# Patient Record
Sex: Female | Born: 1970 | Race: White | Hispanic: No | Marital: Married | Smoking: Former smoker
Health system: Southern US, Community
[De-identification: ages and names within clinical notes are randomized; demographics above are authoritative.]

## PROBLEM LIST (undated history)

## (undated) DIAGNOSIS — G9332 Myalgic encephalomyelitis/chronic fatigue syndrome: Secondary | ICD-10-CM

## (undated) DIAGNOSIS — R053 Chronic cough: Secondary | ICD-10-CM

## (undated) DIAGNOSIS — E785 Hyperlipidemia, unspecified: Secondary | ICD-10-CM

## (undated) DIAGNOSIS — M67921 Unspecified disorder of synovium and tendon, right upper arm: Secondary | ICD-10-CM

## (undated) DIAGNOSIS — R232 Flushing: Secondary | ICD-10-CM

## (undated) DIAGNOSIS — E8881 Metabolic syndrome: Secondary | ICD-10-CM

## (undated) DIAGNOSIS — R5382 Chronic fatigue, unspecified: Secondary | ICD-10-CM

## (undated) DIAGNOSIS — R768 Other specified abnormal immunological findings in serum: Secondary | ICD-10-CM

## (undated) DIAGNOSIS — F5104 Psychophysiologic insomnia: Secondary | ICD-10-CM

## (undated) DIAGNOSIS — R600 Localized edema: Secondary | ICD-10-CM

## (undated) DIAGNOSIS — E559 Vitamin D deficiency, unspecified: Secondary | ICD-10-CM

## (undated) DIAGNOSIS — T7840XA Allergy, unspecified, initial encounter: Secondary | ICD-10-CM

## (undated) DIAGNOSIS — F418 Other specified anxiety disorders: Secondary | ICD-10-CM

## (undated) DIAGNOSIS — R0683 Snoring: Secondary | ICD-10-CM

## (undated) DIAGNOSIS — M359 Systemic involvement of connective tissue, unspecified: Secondary | ICD-10-CM

## (undated) DIAGNOSIS — M3500 Sicca syndrome, unspecified: Secondary | ICD-10-CM

## (undated) DIAGNOSIS — Z803 Family history of malignant neoplasm of breast: Secondary | ICD-10-CM

## (undated) DIAGNOSIS — R05 Cough: Secondary | ICD-10-CM

## (undated) DIAGNOSIS — M2550 Pain in unspecified joint: Secondary | ICD-10-CM

## (undated) DIAGNOSIS — M5412 Radiculopathy, cervical region: Secondary | ICD-10-CM

## (undated) HISTORY — DX: Allergy, unspecified, initial encounter: T78.40XA

## (undated) HISTORY — DX: Unspecified disorder of synovium and tendon, right upper arm: M67.921

## (undated) HISTORY — DX: Snoring: R06.83

## (undated) HISTORY — DX: Sjogren syndrome, unspecified: M35.00

## (undated) HISTORY — DX: Myalgic encephalomyelitis/chronic fatigue syndrome: G93.32

## (undated) HISTORY — DX: Hyperlipidemia, unspecified: E78.5

## (undated) HISTORY — DX: Chronic fatigue, unspecified: R53.82

## (undated) HISTORY — DX: Flushing: R23.2

## (undated) HISTORY — DX: Chronic cough: R05.3

## (undated) HISTORY — PX: ENDOMETRIAL ABLATION: SHX621

## (undated) HISTORY — DX: Cough: R05

## (undated) HISTORY — PX: CHOLECYSTECTOMY: SHX55

## (undated) HISTORY — DX: Radiculopathy, cervical region: M54.12

## (undated) HISTORY — DX: Metabolic syndrome: E88.81

## (undated) HISTORY — DX: Other specified anxiety disorders: F41.8

## (undated) HISTORY — DX: Other specified abnormal immunological findings in serum: R76.8

## (undated) HISTORY — DX: Pain in unspecified joint: M25.50

## (undated) HISTORY — DX: Systemic involvement of connective tissue, unspecified: M35.9

## (undated) HISTORY — DX: Psychophysiologic insomnia: F51.04

## (undated) HISTORY — PX: TUBAL LIGATION: SHX77

## (undated) HISTORY — PX: NASAL SINUS SURGERY: SHX719

## (undated) HISTORY — PX: ABDOMINAL HYSTERECTOMY: SHX81

## (undated) HISTORY — DX: Localized edema: R60.0

## (undated) HISTORY — DX: Metabolic syndrome: E88.810

## (undated) HISTORY — DX: Vitamin D deficiency, unspecified: E55.9

## (undated) HISTORY — DX: Family history of malignant neoplasm of breast: Z80.3

---

## 1998-03-13 HISTORY — PX: REDUCTION MAMMAPLASTY: SUR839

## 1998-03-13 HISTORY — PX: BREAST REDUCTION SURGERY: SHX8

## 2007-09-16 ENCOUNTER — Emergency Department: Payer: Self-pay | Admitting: Emergency Medicine

## 2007-10-30 ENCOUNTER — Ambulatory Visit: Payer: Self-pay | Admitting: Unknown Physician Specialty

## 2008-04-02 ENCOUNTER — Ambulatory Visit: Payer: Self-pay | Admitting: Unknown Physician Specialty

## 2008-06-16 ENCOUNTER — Ambulatory Visit: Payer: Self-pay

## 2008-10-28 ENCOUNTER — Ambulatory Visit: Payer: Self-pay | Admitting: Family Medicine

## 2009-05-09 ENCOUNTER — Emergency Department: Payer: Self-pay | Admitting: Emergency Medicine

## 2010-11-29 ENCOUNTER — Ambulatory Visit: Payer: Self-pay | Admitting: Family Medicine

## 2011-02-21 ENCOUNTER — Other Ambulatory Visit (HOSPITAL_COMMUNITY): Payer: Self-pay | Admitting: Orthopedic Surgery

## 2011-02-21 DIAGNOSIS — M25521 Pain in right elbow: Secondary | ICD-10-CM

## 2011-02-28 ENCOUNTER — Other Ambulatory Visit (HOSPITAL_COMMUNITY): Payer: Self-pay

## 2011-02-28 ENCOUNTER — Encounter (HOSPITAL_COMMUNITY): Payer: Self-pay

## 2011-03-06 ENCOUNTER — Encounter (HOSPITAL_COMMUNITY)
Admission: RE | Admit: 2011-03-06 | Discharge: 2011-03-06 | Disposition: A | Discharge status: Home / Self Care | Source: Ambulatory Visit | Attending: Orthopedic Surgery | Admitting: Orthopedic Surgery

## 2011-03-06 ENCOUNTER — Ambulatory Visit (HOSPITAL_COMMUNITY)
Admission: RE | Admit: 2011-03-06 | Discharge: 2011-03-06 | Disposition: A | Discharge status: Home / Self Care | Source: Ambulatory Visit | Attending: Orthopedic Surgery | Admitting: Orthopedic Surgery

## 2011-03-06 DIAGNOSIS — M25521 Pain in right elbow: Secondary | ICD-10-CM

## 2011-03-06 DIAGNOSIS — M25529 Pain in unspecified elbow: Secondary | ICD-10-CM | POA: Insufficient documentation

## 2011-03-06 MED ORDER — TECHNETIUM TC 99M MEDRONATE IV KIT
25.0000 | PACK | Freq: Once | INTRAVENOUS | Status: AC | PRN
  Administered 2011-03-06: 25 via INTRAVENOUS

## 2011-05-26 ENCOUNTER — Ambulatory Visit: Payer: Self-pay | Admitting: Family Medicine

## 2011-12-20 ENCOUNTER — Ambulatory Visit: Payer: Self-pay | Admitting: Family Medicine

## 2013-04-04 ENCOUNTER — Ambulatory Visit: Payer: Self-pay | Admitting: Family Medicine

## 2013-07-18 ENCOUNTER — Ambulatory Visit: Payer: Self-pay | Admitting: Family Medicine

## 2013-07-18 LAB — HM MAMMOGRAPHY: HM MAMMO: NORMAL

## 2013-09-16 ENCOUNTER — Ambulatory Visit: Payer: Self-pay | Admitting: Obstetrics and Gynecology

## 2013-09-16 LAB — BASIC METABOLIC PANEL
Anion Gap: 6 — ABNORMAL LOW (ref 7–16)
BUN: 11 mg/dL (ref 7–18)
CREATININE: 0.92 mg/dL (ref 0.60–1.30)
Calcium, Total: 8.9 mg/dL (ref 8.5–10.1)
Chloride: 101 mmol/L (ref 98–107)
Co2: 30 mmol/L (ref 21–32)
EGFR (African American): >60
Glucose: 89 mg/dL (ref 65–99)
OSMOLALITY: 273 (ref 275–301)
POTASSIUM: 3.8 mmol/L (ref 3.5–5.1)
Sodium: 137 mmol/L (ref 136–145)

## 2013-09-16 LAB — CBC
HCT: 43.7 % (ref 35.0–47.0)
HGB: 14.9 g/dL (ref 12.0–16.0)
MCH: 30.4 pg (ref 26.0–34.0)
MCHC: 34 g/dL (ref 32.0–36.0)
MCV: 89 fL (ref 80–100)
PLATELETS: 280 10*3/uL (ref 150–440)
RBC: 4.89 10*6/uL (ref 3.80–5.20)
RDW: 13.7 % (ref 11.5–14.5)
WBC: 6 10*3/uL (ref 3.6–11.0)

## 2013-09-22 ENCOUNTER — Ambulatory Visit: Payer: Self-pay | Admitting: Obstetrics and Gynecology

## 2013-09-25 LAB — PATHOLOGY REPORT

## 2014-03-20 LAB — LIPID PANEL
CHOLESTEROL: 187 mg/dL (ref 0–200)
HDL: 58 mg/dL (ref 35–70)
LDL Cholesterol: 98 mg/dL
Triglycerides: 155 mg/dL (ref 40–160)

## 2014-07-04 NOTE — Op Note (Signed)
PATIENT NAME:  Tracy Tracy [name], Tracy Tracy [name] MR#:  [id number] DATE OF BIRTH:  03/27/1970  DATE OF PROCEDURE:  09/22/2013  PREOPERATIVE DIAGNOSES:  1. Chronic pelvic pain.  2. Family history of ovarian cancer.  3. Ovarian mass.   POSTOPERATIVE DIAGNOSES:  1. Chronic pelvic pain.  2. Family history of ovarian cancer.  3. Ovarian mass. 4. Pelvic adhesive disease.   SURGICAL PROCEDURE:  Laparoscopic BSO.   SURGEON: Dr. Daphine DeutscherMartin Tiasia Weberg.   FIRST ASSISTANT: Dr. Valentino Saxonherry.   ANESTHESIA: General endotracheal.   INDICATIONS: The patient is Tracy [name] 44 year old white female status post hysterectomy in the past with chronic pelvic pain and family history of ovarian cancer. Recent ultrasound demonstrated Tracy [name] possible lesion consistent with Tracy [name] dermoid cyst. The patient desired definitive treatment.   FINDINGS AT SURGERY:  Revealed extensive bowel, omental, and adnexal adhesions bilaterally. Adhesiolysis was performed. Tubes and ovaries were removed.   DESCRIPTION OF THE PROCEDURE: The patient was brought to the operating room, was placed in the supine position. General endotracheal anesthesia was induced without difficulty. She was placed in the dorsal lithotomy position using the bumblebee stirrups. Tracy [name] ChloraPrep and Betadine abdominal, perineal, intravaginal prep and drape was performed in standard fashion.  Red Robinsion catheter was used to drain 25 mL of urine from the bladder. Tracy [name] sponge stick was placed into the vagina. Tracy [name] subumbilical vertical incision 5 mm in length was made. The Optiview laparoscopic trocar system was used to place Tracy [name] 5 mm port under direct entry into the abdominal pelvic cavity. No bowel or vascular injury was encountered. Pneumoperitoneum was created. Tracy [name] 10 mm port was placed in the right lower quadrant and Tracy [name] 5 mm port in the left lower quadrants, respectively. The findings were photodocumented. The adhesiolysis and oophorectomy was then performed with both the Ace Harmonic scalpel as well as the  aid of alligator graspers. The adhesions between the omentum and bowel and pelvic sidewall were taken down using the Ace Harmonic scalpel. Once anatomy was reasonably restored, the infundibulopelvic ligament was clamped, desiccated, and cut. The remainder of the ligamentous attachments to the pelvic sidewall were likewise clamped, desiccated, and cut. This freed up the ovary and tube. Sequentially, each tube and ovary was removed with the EndoCatch instrument. Tracy [name] similar procedure was carried out on the contralateral side. Good hemostasis was noted. The ureters were noted to be not in the region of the adhesiolysis. Upon completion of the procedure, the instrumentation was removed from the abdominopelvic cavity. Pneumoperitoneum was released. The incisions were closed with 0 Vicryl on the fascia in the 10 mm port site. The other incisions were closed with 4-0 plain suture and Dermabond. Dressings were placed over the incisions. The patient was then awakened, mobilized, and taken to the recovery room in satisfactory condition.  Estimated blood loss was 10 mL. Urine output was 25 mL.  IV fluids were 2 liters.  No antibiotics were given prophylactically. All instruments, needle, and sponge counts were verified as correct.    ____________________________ Tracy Tracy [name]. Wade Sigala, MD mad:dd D: 09/22/2013 16:56:25 ET T: 09/23/2013 02:50:35 ET JOB#: [id number]  cc: Daphine DeutscherMartin Tracy [name]. Tracy Grillo, MD, <Dictator> [facility] Tracy Tracy [name] Demetreus Lothamer MD ELECTRONICALLY SIGNED 09/23/2013 13:29

## 2014-09-17 ENCOUNTER — Encounter: Payer: Self-pay | Admitting: Obstetrics and Gynecology

## 2014-09-28 ENCOUNTER — Other Ambulatory Visit: Payer: Self-pay | Admitting: Family Medicine

## 2014-09-28 DIAGNOSIS — Z1231 Encounter for screening mammogram for malignant neoplasm of breast: Secondary | ICD-10-CM

## 2014-10-01 ENCOUNTER — Ambulatory Visit

## 2014-10-23 ENCOUNTER — Other Ambulatory Visit: Payer: Self-pay | Admitting: Family Medicine

## 2014-10-23 NOTE — Telephone Encounter (Signed)
Patient requesting refill. 

## 2014-10-26 ENCOUNTER — Other Ambulatory Visit: Payer: Self-pay | Admitting: Family Medicine

## 2014-12-02 ENCOUNTER — Encounter: Payer: Self-pay | Admitting: Family Medicine

## 2014-12-02 DIAGNOSIS — M659 Synovitis and tenosynovitis, unspecified: Secondary | ICD-10-CM | POA: Insufficient documentation

## 2014-12-08 ENCOUNTER — Encounter: Payer: Self-pay | Admitting: Family Medicine

## 2014-12-08 DIAGNOSIS — M6790 Unspecified disorder of synovium and tendon, unspecified site: Secondary | ICD-10-CM | POA: Insufficient documentation

## 2014-12-08 DIAGNOSIS — M5412 Radiculopathy, cervical region: Secondary | ICD-10-CM | POA: Insufficient documentation

## 2014-12-08 DIAGNOSIS — Z90710 Acquired absence of both cervix and uterus: Secondary | ICD-10-CM | POA: Insufficient documentation

## 2014-12-08 DIAGNOSIS — Z720 Tobacco use: Secondary | ICD-10-CM | POA: Insufficient documentation

## 2014-12-08 DIAGNOSIS — E785 Hyperlipidemia, unspecified: Secondary | ICD-10-CM | POA: Insufficient documentation

## 2014-12-08 DIAGNOSIS — E8881 Metabolic syndrome: Secondary | ICD-10-CM | POA: Insufficient documentation

## 2014-12-08 DIAGNOSIS — R5382 Chronic fatigue, unspecified: Secondary | ICD-10-CM

## 2014-12-08 DIAGNOSIS — R760 Raised antibody titer: Secondary | ICD-10-CM | POA: Insufficient documentation

## 2014-12-08 DIAGNOSIS — D8989 Other specified disorders involving the immune mechanism, not elsewhere classified: Secondary | ICD-10-CM | POA: Insufficient documentation

## 2014-12-08 DIAGNOSIS — F329 Major depressive disorder, single episode, unspecified: Secondary | ICD-10-CM | POA: Insufficient documentation

## 2014-12-08 DIAGNOSIS — E669 Obesity, unspecified: Secondary | ICD-10-CM | POA: Insufficient documentation

## 2014-12-08 DIAGNOSIS — R600 Localized edema: Secondary | ICD-10-CM | POA: Insufficient documentation

## 2014-12-08 DIAGNOSIS — R9431 Abnormal electrocardiogram [ECG] [EKG]: Secondary | ICD-10-CM | POA: Insufficient documentation

## 2014-12-08 DIAGNOSIS — M3500 Sicca syndrome, unspecified: Secondary | ICD-10-CM | POA: Insufficient documentation

## 2014-12-08 DIAGNOSIS — Z803 Family history of malignant neoplasm of breast: Secondary | ICD-10-CM | POA: Insufficient documentation

## 2014-12-08 DIAGNOSIS — G4700 Insomnia, unspecified: Secondary | ICD-10-CM | POA: Insufficient documentation

## 2014-12-08 DIAGNOSIS — R0683 Snoring: Secondary | ICD-10-CM | POA: Insufficient documentation

## 2014-12-08 DIAGNOSIS — J302 Other seasonal allergic rhinitis: Secondary | ICD-10-CM | POA: Insufficient documentation

## 2014-12-08 DIAGNOSIS — G9332 Myalgic encephalomyelitis/chronic fatigue syndrome: Secondary | ICD-10-CM | POA: Insufficient documentation

## 2014-12-09 ENCOUNTER — Ambulatory Visit (INDEPENDENT_AMBULATORY_CARE_PROVIDER_SITE_OTHER): Admitting: Family Medicine

## 2014-12-09 ENCOUNTER — Encounter: Payer: Self-pay | Admitting: Family Medicine

## 2014-12-09 VITALS — BP 112/84 | HR 107 | Temp 98.8°F | Resp 18 | Ht 63.0 in | Wt 211.0 lb

## 2014-12-09 DIAGNOSIS — E785 Hyperlipidemia, unspecified: Secondary | ICD-10-CM | POA: Diagnosis not present

## 2014-12-09 DIAGNOSIS — M26629 Arthralgia of temporomandibular joint, unspecified side: Secondary | ICD-10-CM | POA: Insufficient documentation

## 2014-12-09 DIAGNOSIS — Z1239 Encounter for other screening for malignant neoplasm of breast: Secondary | ICD-10-CM

## 2014-12-09 DIAGNOSIS — M2662 Arthralgia of temporomandibular joint: Secondary | ICD-10-CM

## 2014-12-09 DIAGNOSIS — L659 Nonscarring hair loss, unspecified: Secondary | ICD-10-CM | POA: Diagnosis not present

## 2014-12-09 DIAGNOSIS — M359 Systemic involvement of connective tissue, unspecified: Secondary | ICD-10-CM | POA: Diagnosis not present

## 2014-12-09 DIAGNOSIS — E8881 Metabolic syndrome: Secondary | ICD-10-CM

## 2014-12-09 DIAGNOSIS — Z114 Encounter for screening for human immunodeficiency virus [HIV]: Secondary | ICD-10-CM

## 2014-12-09 DIAGNOSIS — Z0000 Encounter for general adult medical examination without abnormal findings: Secondary | ICD-10-CM

## 2014-12-09 DIAGNOSIS — Z23 Encounter for immunization: Secondary | ICD-10-CM | POA: Diagnosis not present

## 2014-12-09 DIAGNOSIS — M797 Fibromyalgia: Secondary | ICD-10-CM | POA: Diagnosis not present

## 2014-12-09 DIAGNOSIS — E8940 Asymptomatic postprocedural ovarian failure: Secondary | ICD-10-CM

## 2014-12-09 DIAGNOSIS — N958 Other specified menopausal and perimenopausal disorders: Secondary | ICD-10-CM

## 2014-12-09 DIAGNOSIS — Z01419 Encounter for gynecological examination (general) (routine) without abnormal findings: Secondary | ICD-10-CM

## 2014-12-09 MED ORDER — ESTRADIOL 1 MG PO TABS: 1.0000 mg | ORAL_TABLET | Freq: Every day | ORAL | Status: DC

## 2014-12-09 MED ORDER — OXYCODONE HCL 10 MG PO TABS: 10.0000 mg | ORAL_TABLET | Freq: Four times a day (QID) | ORAL | Status: DC

## 2014-12-09 NOTE — Progress Notes (Signed)
Name: Tracy [name]   MRN: [id number]    DOB: Jan 06, 1971   Date:12/09/2014       Progress Note  Subjective  Chief Complaint  Chief Complaint  Patient presents with  . Annual Exam    HPI  Well Woman Exam:  She is status post-hysterectomy and oophorectomy , on Estradiol 1 mg daily to control her menopausal symptoms. Denies hot flashes or night sweats.    Hair Loss: she has noticed significant hair loss over the past 6 months, getting worse. She loses clumps of hair after showering.   Connective Tissue Disorder/CFS/Sjogreens Disease: she sees Dr. Janene Harvey in [place] . She is feeling very tired, on Oxycodone for pain control and is waiting for referral to pain clinic. She would like to have labs done so I can send results to Rheumatologist. She has skin photosensitivity, dry mouth, dry eyes, muscles aches, joint aches. Feels tired all the time. Taking medications given by Dr. Janene Harvey.   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 Denies polyphagia,  or polyuria. Drinks water all the time because of Sjogrens  Hyperlipidemia: taking Pravastatin and is compliant with medication. No chest pain or palpitation  Patient Active Problem List   Diagnosis Date Noted  . Connective tissue disorder 12/09/2014  . Abnormal ECG 12/08/2014  . Abnormal antinuclear antibody titer 12/08/2014  . Anxiety and depression 12/08/2014  . Edema leg 12/08/2014  . 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 Dyslipidemia 12/08/2014  . Family history of breast cancer 12/08/2014  . H/O: hysterectomy 12/08/2014  . Dysmetabolic syndrome 12/08/2014  . Obesity (BMI 30-39.9) 12/08/2014  . Tobacco abuse 12/08/2014  . Disorder of tendon 12/08/2014  . Sjogren's syndrome 12/08/2014  . Allergic rhinitis, seasonal 12/08/2014  . Snores 12/08/2014  .  Tenosynovitis of thumb 12/02/2014    Past Surgical History  Procedure Laterality Date  . Abdominal hysterectomy    . Cholecystectomy    . Nasal sinus surgery    . Tubal ligation    . Endometrial ablation      ovarian cyst removal  . Breast reduction surgery Bilateral 2000    Illionis    Family History  Problem Relation Age of Onset  . Hypertension Mother   . Allergic rhinitis Mother   . Hypertension Father   . Alcohol abuse Father   . Heart disease Father   . Alcohol abuse Brother   . Hypertension Brother     Social History   Social History  . Marital Status: Married    Spouse Name: N/A  . Number of Children: N/A  . Years of Education: N/A   Occupational History  . Not on file.   Social History Main Topics  . Smoking status: Current Every Day Smoker -- 0.75 packs/day for 26 years    Types: Cigarettes    Start date: 12/08/1988  . Smokeless tobacco: Never Used  . Alcohol Use: No  . Drug Use: No  . Sexual Activity:    Partners: Male    Birth Control/ Protection: Other-see comments     Comment: Hysterectomy   Other Topics Concern  . Not on file   Social History Narrative     Current outpatient prescriptions:  .  amitriptyline (ELAVIL) 25 MG tablet, Take by mouth., Disp: , Rfl:  .  amphetamine-dextroamphetamine (ADDERALL) 10 MG tablet, Take by mouth., Disp: , Rfl:  .  aspirin 81 MG tablet, Take by mouth., Disp: , Rfl:  .  betamethasone dipropionate (DIPROLENE) 0.05 % cream, BETAMETHASONE DIPROPIONATE, 0.05% (External Cream) - Historical Medication  apply twice a day as needed (0.05 %) Active Comments: Medication taken as needed. , Disp: , Rfl:  .  Biotin (BIOTIN 5000) 5 MG CAPS, Take by mouth., Disp: , Rfl:  .  Cholecalciferol (VITAMIN D) 2000 UNITS tablet, Take by mouth., Disp: , Rfl:  .  diazepam (VALIUM) 5 MG tablet, Take by mouth., Disp: , Rfl:  .  DULoxetine (CYMBALTA) 30 MG capsule, Take by mouth., Disp: , Rfl:  .  estradiol (ESTRACE) 1 MG tablet,  Take 1 tablet (1 mg total) by mouth daily., Disp: 90 tablet, Rfl: 4 .  fluticasone (FLONASE) 50 MCG/ACT nasal spray, Place into the nose., Disp: , Rfl:  .  Folic Acid-Vit B6-Vit B12 (B COMPLEX-FOLIC ACID) 500-5-200 MCG-MG-MCG TABS, Take by mouth., Disp: , Rfl:  .  furosemide (LASIX) 40 MG tablet, Take by mouth., Disp: , Rfl:  .  hydroxychloroquine (PLAQUENIL) 200 MG tablet, Take by mouth., Disp: , Rfl:  .  Multiple Vitamins-Minerals (MULTI VITAMIN/MINERALS) TABS, Take by mouth., Disp: , Rfl:  .  mycophenolate (CELLCEPT) 500 MG tablet, Take by mouth., Disp: , Rfl:  .  nabumetone (RELAFEN) 750 MG tablet, Take by mouth., Disp: , Rfl:  .  pravastatin (PRAVACHOL) 20 MG tablet, TAKE 1 TABLET EVERY EVENING FOR CHOLESTEROL, Disp: 90 tablet, Rfl: 1 .  tizanidine (ZANAFLEX) 2 MG capsule, Take by mouth., Disp: , Rfl:  .  Oxycodone HCl 10 MG TABS, Take 1 tablet (10 mg total) by mouth 4 (four) times daily., Disp: 120 tablet, Rfl: 0  Allergies  Allergen Reactions  . Gabapentin Other (See Comments) and Swelling  . Meperidine   . Tramadol      ROS  Constitutional: Negative for fever or weight change.  Respiratory: Negative for cough and shortness of breath.   Cardiovascular: Negative for chest pain or palpitations.  Gastrointestinal: Negative for abdominal pain, no bowel changes.  Musculoskeletal: Negative for gait problem , positive for  joint swelling - tenosynovitis.  Skin: Negative for rash.  Neurological: Negative for dizziness or headache.  No other specific complaints in a complete review of systems (except as listed in HPI above).  Objective  Filed Vitals:   12/09/14 0851  BP: 112/84  Pulse: 107  Temp: 98.8 F (37.1 C)  TempSrc: Oral  Resp: 18  Height:  (1.6 m)  Weight: 211 lb (95.709 kg)  SpO2: 96%    Body mass index is 37.39 kg/(m^2).  Physical Exam  Constitutional: Patient appears well-developed and well-nourished. No distress.  HENT: Head: Normocephalic and  atraumatic. Ears: B TMs ok, no erythema or effusion; Nose: Nose normal. Mouth/Throat: Oropharynx is clear and moist. No oropharyngeal exudate.  Eyes: Conjunctivae and EOM are normal. Pupils are equal, round, and reactive to light. No scleral icterus.  Neck: Normal range of motion. Neck supple. No JVD present. No thyromegaly present.  Cardiovascular: Normal rate, regular rhythm and normal heart sounds.  No murmur heard. No BLE edema. Pulmonary/Chest: Effort normal and breath sounds normal. No respiratory distress. Abdominal: Soft. Bowel sounds are normal, no distension. Mild tenderness on LLQ - possibly from scar tissue. no masses Breast: no lumps or masses, no nipple discharge or rashes, scars from breast reduction surgery  FEMALE GENITALIA:  External genitalia normal External urethra normal Vaginal vault normal without discharge or lesions RECTAL: not done Musculoskeletal: Normal range of  motion, no joint effusions.  Trigger point positives Neurological: he is alert and oriented to person, place, and time. No cranial nerve deficit. Coordination, balance, strength, speech and gait are normal.  Skin: Skin is warm and dry. No rash noted. No erythema.  Psychiatric: Patient has a normal mood and affect. behavior is normal. Judgment and thought content normal.  PHQ2/9: Depression screen PHQ 2/9 12/09/2014  Decreased Interest 0  Down, Depressed, Hopeless 0  PHQ - 2 Score 0     Fall Risk: Fall Risk  12/09/2014  Falls in the past year? Yes  Number falls in past yr: 2 or more  Injury with Fall? Yes    Functional Status Survey: Is the patient deaf or have difficulty hearing?: No Does the patient have difficulty seeing, even when wearing glasses/contacts?: No Does the patient have difficulty concentrating, remembering, or making decisions?: No Does the patient have difficulty walking or climbing stairs?: No Does the patient have difficulty dressing or bathing?: No Does the patient have  difficulty doing errands alone such as visiting a doctor's office or shopping?: No   Assessment & Plan  1. Well woman exam  Discussed importance of 150 minutes of physical activity weekly, eat two servings of fish weekly, eat one serving of tree nuts ( cashews, pistachios, pecans, almonds.Marland Kitchen) every other day, eat 6 servings of fruit/vegetables daily and drink plenty of water and avoid sweet beverages.    - CBC with Differential/Platelet - Comprehensive metabolic panel - TSH - Vitamin B12 - Vit D  25 hydroxy (rtn osteoporosis monitoring)  2. Needs flu shot  - Flu Vaccine QUAD 36+ mos PF IM (Fluarix & Fluzone Quad PF)  3. Hair loss  - TSH  4. Encounter for screening for HIV  - HIV antibody  5. Dyslipidemia  Continue medication  - Lipid panel  6. Dysmetabolic syndrome  Discussed healthy diet  - Hemoglobin A1c  7. Connective tissue disorder  Oxycodone prescription was not given to her - it was just entered in the system because she is getting it from Dr. Janene Harvey - C-reactive protein - Rheumatoid Factor - Antinuclear Antib (ANA) - Sjogrens syndrome-A extractable nuclear antibody - Sjogrens syndrome-B extractable nuclear antibody  8. Surgical menopause  - estradiol (ESTRACE) 1 MG tablet; Take 1 tablet (1 mg total) by mouth daily.  Dispense: 90 tablet; Refill: 4  - C-reactive protein - Rheumatoid Factor - Antinuclear Antib (ANA) - Sjogrens syndrome-A extractable nuclear antibody - Sjogrens syndrome-B extractable nuclear antibody  9. TMJ arthralgia   10. Fibromyalgia  Continue Duloxetine and Elavi  11. Breast cancer screening  - MM Digital Screening; Future

## 2014-12-10 LAB — CBC WITH DIFFERENTIAL/PLATELET
BASOS ABS: 0 10*3/uL (ref 0.0–0.2)
Basos: 0 %
EOS (ABSOLUTE): 0.1 10*3/uL (ref 0.0–0.4)
Eos: 2 %
Hematocrit: 42.3 % (ref 34.0–46.6)
Hemoglobin: 14.8 g/dL (ref 11.1–15.9)
IMMATURE GRANULOCYTES: 0 %
Immature Grans (Abs): 0 10*3/uL (ref 0.0–0.1)
LYMPHS: 34 %
Lymphocytes Absolute: 1.7 10*3/uL (ref 0.7–3.1)
MCH: 30.2 pg (ref 26.6–33.0)
MCHC: 35 g/dL (ref 31.5–35.7)
MCV: 86 fL (ref 79–97)
MONOS ABS: 0.3 10*3/uL (ref 0.1–0.9)
Monocytes: 5 %
NEUTROS ABS: 2.9 10*3/uL (ref 1.4–7.0)
NEUTROS PCT: 59 %
PLATELETS: 286 10*3/uL (ref 150–379)
RBC: 4.9 x10E6/uL (ref 3.77–5.28)
RDW: 13 % (ref 12.3–15.4)
WBC: 5.1 10*3/uL (ref 3.4–10.8)

## 2014-12-10 LAB — HIV ANTIBODY (ROUTINE TESTING W REFLEX): HIV SCREEN 4TH GENERATION: NONREACTIVE

## 2014-12-10 LAB — LIPID PANEL
CHOL/HDL RATIO: 3.5 ratio (ref 0.0–4.4)
Cholesterol, Total: 219 mg/dL — ABNORMAL HIGH (ref 100–199)
HDL: 62 mg/dL (ref >39)
LDL Calculated: 111 mg/dL — ABNORMAL HIGH (ref 0–99)
Triglycerides: 231 mg/dL — ABNORMAL HIGH (ref 0–149)
VLDL Cholesterol Cal: 46 mg/dL — ABNORMAL HIGH (ref 5–40)

## 2014-12-10 LAB — TSH: TSH: 2.6 u[IU]/mL (ref 0.450–4.500)

## 2014-12-10 LAB — COMPREHENSIVE METABOLIC PANEL
A/G RATIO: 1.4 (ref 1.1–2.5)
ALT: 11 IU/L (ref 0–32)
AST: 12 IU/L (ref 0–40)
Albumin: 4.2 g/dL (ref 3.5–5.5)
Alkaline Phosphatase: 101 IU/L (ref 39–117)
BUN/Creatinine Ratio: 13 (ref 9–23)
BUN: 12 mg/dL (ref 6–24)
CHLORIDE: 94 mmol/L — AB (ref 97–108)
CO2: 30 mmol/L — ABNORMAL HIGH (ref 18–29)
Calcium: 9.9 mg/dL (ref 8.7–10.2)
Creatinine, Ser: 0.89 mg/dL (ref 0.57–1.00)
GFR calc Af Amer: 91 mL/min/{1.73_m2} (ref >59)
GFR calc non Af Amer: 79 mL/min/{1.73_m2} (ref >59)
GLUCOSE: 96 mg/dL (ref 65–99)
Globulin, Total: 3 g/dL (ref 1.5–4.5)
POTASSIUM: 3.4 mmol/L — AB (ref 3.5–5.2)
Sodium: 143 mmol/L (ref 134–144)
Total Protein: 7.2 g/dL (ref 6.0–8.5)

## 2014-12-10 LAB — HEMOGLOBIN A1C
ESTIMATED AVERAGE GLUCOSE: 126 mg/dL
HEMOGLOBIN A1C: 6 % — AB (ref 4.8–5.6)

## 2014-12-10 LAB — VITAMIN B12: Vitamin B-12: 485 pg/mL (ref 211–946)

## 2014-12-10 LAB — VITAMIN D 25 HYDROXY (VIT D DEFICIENCY, FRACTURES): Vit D, 25-Hydroxy: 30.2 ng/mL (ref 30.0–100.0)

## 2014-12-10 LAB — RHEUMATOID FACTOR: Rhuematoid fact SerPl-aCnc: <10 IU/mL (ref 0.0–13.9)

## 2014-12-10 LAB — SJOGRENS SYNDROME-B EXTRACTABLE NUCLEAR ANTIBODY: ENA SSB (LA) AB: 7.5 AI — AB (ref 0.0–0.9)

## 2014-12-10 LAB — ANA: Anti Nuclear Antibody(ANA): POSITIVE — AB

## 2014-12-10 LAB — SJOGRENS SYNDROME-A EXTRACTABLE NUCLEAR ANTIBODY

## 2014-12-10 LAB — C-REACTIVE PROTEIN: CRP: 3.6 mg/L (ref 0.0–4.9)

## 2014-12-11 NOTE — Progress Notes (Signed)
Patient notified and states she will pick up a copy of her labs and take them to her Rheumatologist.

## 2015-01-19 ENCOUNTER — Other Ambulatory Visit: Payer: Self-pay | Admitting: Family Medicine

## 2015-01-19 NOTE — Telephone Encounter (Signed)
Sent to Dr. Sowles  

## 2015-01-30 ENCOUNTER — Ambulatory Visit: Admission: EM | Admit: 2015-01-30 | Discharge: 2015-01-30 | Disposition: A | Discharge status: Home / Self Care

## 2015-02-01 ENCOUNTER — Encounter: Payer: Self-pay | Admitting: Emergency Medicine

## 2015-02-01 ENCOUNTER — Ambulatory Visit
Admission: EM | Admit: 2015-02-01 | Discharge: 2015-02-01 | Disposition: A | Discharge status: Home / Self Care | Attending: Family Medicine | Admitting: Family Medicine

## 2015-02-01 DIAGNOSIS — J029 Acute pharyngitis, unspecified: Secondary | ICD-10-CM

## 2015-02-01 DIAGNOSIS — D899 Disorder involving the immune mechanism, unspecified: Secondary | ICD-10-CM

## 2015-02-01 DIAGNOSIS — K1370 Unspecified lesions of oral mucosa: Secondary | ICD-10-CM | POA: Diagnosis not present

## 2015-02-01 DIAGNOSIS — B349 Viral infection, unspecified: Secondary | ICD-10-CM | POA: Diagnosis not present

## 2015-02-01 DIAGNOSIS — Z79899 Other long term (current) drug therapy: Secondary | ICD-10-CM

## 2015-02-01 DIAGNOSIS — D84821 Immunodeficiency due to drugs: Secondary | ICD-10-CM

## 2015-02-01 LAB — RAPID STREP SCREEN (MED CTR MEBANE ONLY): Streptococcus, Group A Screen (Direct): NEGATIVE

## 2015-02-01 MED ORDER — AZITHROMYCIN 250 MG PO TABS: ORAL_TABLET | ORAL | Status: DC

## 2015-02-01 MED ORDER — FIRST-DUKES MOUTHWASH MT SUSP: 20.0000 mL | Freq: Three times a day (TID) | OROMUCOSAL | Status: DC | PRN

## 2015-02-01 NOTE — ED Notes (Signed)
Patient states she developed mouth sores on Saturday, some blisters and redness today

## 2015-02-01 NOTE — ED Provider Notes (Signed)
CSN: [id number]     Arrival date & time 02/01/15  1523 History   First MD Initiated Contact with Patient 02/01/15 1632    Nurses notes were reviewed. Chief Complaint  Patient presents with  . Mouth Lesions   patient states she started having mouth lesions on Friday. Mouth is very painful and cause considerable amount of discomfort. She states that lesions were on the inside of her mouth in the back or throat. By Saturday symptoms were worse she came to the urgent care but because of the long wait 7 to go home which went to bed and stepped to rest Saturday night and Sunday. Today still with ulcerations and lesions in her mouth she came in to be seen and evaluated. She denies any lesions on her hands or feet and states no history of herpes that she is aware of. Fact she was tested for chickenpox and she thought she had chickenpox before but she was found not to be immune at this time. She is taking immune modulators because of her history of surgeons disease and unspecified connective tissue disease. She called her rheumatologist who wanted to be checked for strep because of her weakened immune system system. A portion patient still smokes   (Consider location/radiation/quality/duration/timing/severity/associated sxs/prior Treatment) Patient is a 44 y.o. female presenting with mouth sores. The history is provided by the patient. No language interpreter was used.  Mouth Lesions Location:  Lower lip and buccal mucosa Quality:  Multiple Onset quality:  Sudden Severity:  Severe Progression:  Worsening Chronicity:  New Context: possible infection and stress   Context: not a change in diet and not a change in medications   Relieved by:  Nothing Ineffective treatments:  Topical medications (States gargle saltwater only makes it worse and saltwater [place]) Associated symptoms: congestion, dental pain, malaise, rhinorrhea, sore throat and swollen glands   Associated symptoms: no ear pain, no fever,  no neck pain and no rash     Past Medical History  Diagnosis Date  . Cervical radiculopathy     Dr. Percell Belt  . Positive ANA (antinuclear antibody)   . Anxious depression   . Hot flashes   . Snoring   . Family history of breast cancer   . Polyarthralgia   . Chronic fatigue syndrome   . Dyslipidemia   . Bilateral edema of lower extremity   . Allergy   . Sjogren's disease (HCC)     Dr. Janene Harvey  . Vitamin D deficiency   . Metabolic syndrome   . Connective tissue disease (HCC)   . Chronic insomnia   . Tendinopathy of right elbow   . Chronic cough    Past Surgical History  Procedure Laterality Date  . Abdominal hysterectomy    . Cholecystectomy    . Nasal sinus surgery    . Tubal ligation    . Endometrial ablation      ovarian cyst removal  . Breast reduction surgery Bilateral 2000    Illionis   Family History  Problem Relation Age of Onset  . Hypertension Mother   . Allergic rhinitis Mother   . Hypertension Father   . Alcohol abuse Father   . Heart disease Father   . Alcohol abuse Brother   . Hypertension Brother    Social History  Substance Use Topics  . Smoking status: Current Every Day Smoker -- 0.75 packs/day for 26 years    Types: Cigarettes    Start date: 12/08/1988  . Smokeless tobacco: Never Used  .  Alcohol Use: No   OB History    No data available     Review of Systems  Constitutional: Negative for fever.  HENT: Positive for congestion, mouth sores, rhinorrhea and sore throat. Negative for ear pain.   Musculoskeletal: Negative for neck pain.  Skin: Negative for rash.  All other systems reviewed and are negative.   Allergies  Gabapentin; Meperidine; and Tramadol  Home Medications   Prior to Admission medications   Medication Sig Start Date End Date Taking? Authorizing Provider  amitriptyline (ELAVIL) 25 MG tablet Take by mouth.   Yes Historical Provider, MD  amphetamine-dextroamphetamine (ADDERALL) 10 MG tablet Take by mouth.   Yes  Historical Provider, MD  aspirin 81 MG tablet Take by mouth.   Yes Historical Provider, MD  betamethasone dipropionate (DIPROLENE) 0.05 % cream BETAMETHASONE DIPROPIONATE, 0.05% (External Cream) - Historical Medication  apply twice a day as needed (0.05 %) Active Comments: Medication taken as needed.    Yes Historical Provider, MD  Biotin (BIOTIN 5000) 5 MG CAPS Take by mouth.   Yes Historical Provider, MD  Cholecalciferol (VITAMIN D) 2000 UNITS tablet Take by mouth.   Yes Historical Provider, MD  diazepam (VALIUM) 5 MG tablet Take by mouth.   Yes Historical Provider, MD  DULoxetine (CYMBALTA) 30 MG capsule Take by mouth. 02/18/14  Yes Historical Provider, MD  estradiol (ESTRACE) 1 MG tablet Take 1 tablet (1 mg total) by mouth daily. 12/09/14  Yes Alba CoryKrichna Sowles, MD  furosemide (LASIX) 40 MG tablet Take by mouth.   Yes Historical Provider, MD  hydroxychloroquine (PLAQUENIL) 200 MG tablet Take by mouth.   Yes Historical Provider, MD  Multiple Vitamins-Minerals (MULTI VITAMIN/MINERALS) TABS Take by mouth.   Yes Historical Provider, MD  mycophenolate (CELLCEPT) 500 MG tablet Take by mouth.   Yes Historical Provider, MD  nabumetone (RELAFEN) 750 MG tablet Take by mouth.   Yes Historical Provider, MD  Oxycodone HCl 10 MG TABS Take 1 tablet (10 mg total) by mouth 4 (four) times daily. 12/09/14  Yes Alba CoryKrichna Sowles, MD  pravastatin (PRAVACHOL) 20 MG tablet TAKE 1 TABLET EVERY EVENING FOR CHOLESTEROL 10/26/14  Yes Alba CoryKrichna Sowles, MD  tizanidine (ZANAFLEX) 2 MG capsule Take by mouth.   Yes Historical Provider, MD  azithromycin (ZITHROMAX Z-PAK) 250 MG tablet Take 2 tablets first day and then 1 po a day for 4 days 02/01/15   Hassan RowanEugene Holly Pring, MD  Diphenhyd-Hydrocort-Nystatin (FIRST-DUKES MOUTHWASH) SUSP Use as directed 20 mLs in the mouth or throat 3 (three) times daily as needed. 02/01/15   Hassan RowanEugene Neko Mcgeehan, MD  fluticasone (FLONASE) 50 MCG/ACT nasal spray Place into the nose. 07/26/14   Historical Provider, MD  Folic  Acid-Vit B6-Vit B12 (B COMPLEX-FOLIC ACID) 500-5-200 MCG-MG-MCG TABS Take by mouth.    Historical Provider, MD  metFORMIN (GLUCOPHAGE) 500 MG tablet TAKE 1 TABLET TWICE A DAY 01/19/15   Alba CoryKrichna Sowles, MD   Meds Ordered and Administered this Visit  Medications - No data to display  BP 107/74 mmHg  Pulse 96  Temp(Src) 98 F (36.7 C) (Tympanic)  Resp 18  Ht 5\' 3"  (1.6 m)  Wt 209 lb (94.802 kg)  BMI 37.03 kg/m2  SpO2 95% No data found.   Physical Exam  Constitutional: She appears well-developed and well-nourished.  HENT:  Head: Normocephalic and atraumatic.  Right Ear: Hearing, tympanic membrane, external ear and ear canal normal.  Left Ear: Hearing, tympanic membrane, external ear and ear canal normal. No decreased hearing is noted.  Nose: Mucosal  edema and rhinorrhea present.  Mouth/Throat:    She has some hyperemic lesions the back were mouth and in the thecal area of her lower gum. Strep test was obtained and is pending.  Eyes: Pupils are equal, round, and reactive to light.  Neck: Normal range of motion. Neck supple.  Musculoskeletal: Normal range of motion.  Lymphadenopathy:    She has cervical adenopathy.  Neurological: She is alert.  Skin: Skin is warm and dry. No erythema.  Psychiatric: She has a normal mood and affect.  Vitals reviewed.   ED Course  Procedures (including critical care time)  Labs Review Labs Reviewed  RAPID STREP SCREEN (NOT AT [facility])  CULTURE, GROUP A STREP ([facility] ONLY)    Imaging Review No results found.   Visual Acuity Review  Right Eye Distance:   Left Eye Distance:   Bilateral Distance:    Right Eye Near:   Left Eye Near:    Bilateral Near:       Results for orders placed or performed during the hospital encounter of 02/01/15  Rapid strep screen  Result Value Ref Range   Streptococcus, Group A Screen (Direct) NEGATIVE NEGATIVE    MDM   1. Lesion of mouth   2. Pharyngitis   3. 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 In fact comes suspicious this is a foot mouth illness. If she had any lesions on her hands or feet I would not recommend antibiotic strep test is negative. But since this is a lesions on her hands and feet if strep test is positive we'll place on amoxicillin if negative operative Z-Pak and explained to her the ribs on playing her on anabiotic of restricted culture is because the fact that she is on immune modulating medication. She has 4 cm Magic mouthwash and will prescribe that as a prescription as well. Will give a note for work for today and tomorrow. Follow-up with rheumatologist a PCP by next week if not better.       Hassan Rowan, MD 02/01/15 [phone]

## 2015-02-01 NOTE — Discharge Instructions (Signed)
Sore Throat A sore throat is a painful, burning, sore, or scratchy feeling of the throat. There may be pain or tenderness when swallowing or talking. You may have other symptoms with a sore throat. These include coughing, sneezing, fever, or a swollen neck. A sore throat is often the first sign of another sickness. These sicknesses may include a cold, flu, strep throat, or an infection called mono. Most sore throats go away without medical treatment.  HOME CARE   Only take medicine as told by your doctor.  Drink enough fluids to keep your pee (urine) clear or pale yellow.  Rest as needed.  Try using throat sprays, lozenges, or suck on hard candy (if older than 4 years or as told).  Sip warm liquids, such as broth, herbal tea, or warm water with honey. Try sucking on frozen ice pops or drinking cold liquids.  Rinse the mouth (gargle) with salt water. Mix 1 teaspoon salt with 8 ounces of water.  Do not smoke. Avoid being around others when they are smoking.  Put a humidifier in your bedroom at night to moisten the air. You can also turn on a hot shower and sit in the bathroom for 5-10 minutes. Be sure the bathroom door is closed. GET HELP RIGHT AWAY IF:   You have trouble breathing.  You cannot swallow fluids, soft foods, or your spit (saliva).  You have more puffiness (swelling) in the throat.  Your sore throat does not get better in 7 days.  You feel sick to your stomach (nauseous) and throw up (vomit).  You have a fever or lasting symptoms for more than 2-3 days.  You have a fever and your symptoms suddenly get worse. MAKE SURE YOU:   Understand these instructions.  Will watch your condition.  Will get help right away if you are not doing well or get worse.   This information is not intended to replace advice given to you by your health care provider. Make sure you discuss any questions you have with your health care provider.   Document Released: 12/07/2007 Document  Revised: 11/22/2011 Document Reviewed: 11/05/2011 Elsevier Interactive Patient Education 2016 [employer]  Pharyngitis Pharyngitis is a sore throat (pharynx). There is redness, pain, and swelling of your throat. HOME CARE   Drink enough fluids to keep your pee (urine) clear or pale yellow.  Only take medicine as told by your doctor.  You may get sick again if you do not take medicine as told. Finish your medicines, even if you start to feel better.  Do not take aspirin.  Rest.  Rinse your mouth (gargle) with salt water ( tsp of salt per 1 qt of water) every 1-2 hours. This will help the pain.  If you are not at risk for choking, you can suck on hard candy or sore throat lozenges. GET HELP IF:  You have large, tender lumps on your neck.  You have a rash.  You cough up green, yellow-brown, or bloody spit. GET HELP RIGHT AWAY IF:   You have a stiff neck.  You drool or cannot swallow liquids.  You throw up (vomit) or are not able to keep medicine or liquids down.  You have very bad pain that does not go away with medicine.  You have problems breathing (not from a stuffy nose). MAKE SURE YOU:   Understand these instructions.  Will watch your condition.  Will get help right away if you are not doing well or get worse.  This information is not intended to replace advice given to you by your health care provider. Make sure you discuss any questions you have with your health care provider.   Document Released: 08/16/2007 Document Revised: 12/18/2012 Document Reviewed: 11/04/2012 Elsevier Interactive Patient Education 2016 Else[employer] Inc.   Viral Infections A virus is a type of germ. Viruses can cause:  Minor sore throats.  Aches and pains.  Headaches.  Runny nose.  Rashes.  Watery eyes.  Tiredness.  Coughs.  Loss of appetite.  Feeling sick to your stomach (nausea).  Throwing up (vomiting).  Watery poop (diarrhea). HOME CARE   Only take  medicines as told by your doctor.  Drink enough water and fluids to keep your pee (urine) clear or pale yellow. Sports drinks are a good choice.  Get plenty of rest and eat healthy. Soups and broths with crackers or rice are fine. GET HELP RIGHT AWAY IF:   You have a very bad headache.  You have shortness of breath.  You have chest pain or neck pain.  You have an unusual rash.  You cannot stop throwing up.  You have watery poop that does not stop.  You cannot keep fluids down.  You or your child has a temperature by mouth above 102 F (38.9 C), not controlled by medicine.  Your baby is older than 3 months with a rectal temperature of 102 F (38.9 C) or higher.  Your baby is 363 months old or younger with a rectal temperature of 100.4 F (38 C) or higher. MAKE SURE YOU:   Understand these instructions.  Will watch this condition.  Will get help right away if you are not doing well or get worse.   This information is not intended to replace advice given to you by your health care provider. Make sure you discuss any questions you have with your health care provider.   Document Released: 02/10/2008 Document Revised: 05/22/2011 Document Reviewed: 08/05/2014 Elsevier Interactive Patient Education [employer] Elsevier Inc.

## 2015-02-03 LAB — CULTURE, GROUP A STREP (THRC)

## 2015-02-17 LAB — HM COLONOSCOPY

## 2015-04-23 LAB — CBC AND DIFFERENTIAL
HCT: 44 % (ref 36–46)
Hemoglobin: 15.3 g/dL (ref 12.0–16.0)
PLATELETS: 295 10*3/uL (ref 150–399)
WBC: 8 10*3/mL

## 2015-04-23 LAB — BASIC METABOLIC PANEL: GLUCOSE: 117 mg/dL

## 2015-04-24 ENCOUNTER — Other Ambulatory Visit: Payer: Self-pay | Admitting: Family Medicine

## 2015-05-04 ENCOUNTER — Other Ambulatory Visit: Payer: Self-pay | Admitting: Family Medicine

## 2015-05-04 NOTE — Telephone Encounter (Signed)
Patient requesting refill. 

## 2015-05-26 ENCOUNTER — Encounter: Payer: Self-pay | Admitting: Family Medicine

## 2015-05-26 ENCOUNTER — Ambulatory Visit (INDEPENDENT_AMBULATORY_CARE_PROVIDER_SITE_OTHER): Admitting: Family Medicine

## 2015-05-26 VITALS — HR 96 | Temp 98.2°F | Resp 16 | Ht 63.0 in | Wt 211.4 lb

## 2015-05-26 DIAGNOSIS — M5136 Other intervertebral disc degeneration, lumbar region: Secondary | ICD-10-CM | POA: Diagnosis not present

## 2015-05-26 DIAGNOSIS — M3500 Sicca syndrome, unspecified: Secondary | ICD-10-CM | POA: Diagnosis not present

## 2015-05-26 DIAGNOSIS — R202 Paresthesia of skin: Secondary | ICD-10-CM

## 2015-05-26 DIAGNOSIS — E876 Hypokalemia: Secondary | ICD-10-CM | POA: Diagnosis not present

## 2015-05-26 DIAGNOSIS — E8881 Metabolic syndrome: Secondary | ICD-10-CM | POA: Diagnosis not present

## 2015-05-26 DIAGNOSIS — M5126 Other intervertebral disc displacement, lumbar region: Secondary | ICD-10-CM

## 2015-05-26 DIAGNOSIS — M359 Systemic involvement of connective tissue, unspecified: Secondary | ICD-10-CM

## 2015-05-26 DIAGNOSIS — K2970 Gastritis, unspecified, without bleeding: Secondary | ICD-10-CM | POA: Diagnosis not present

## 2015-05-26 DIAGNOSIS — E785 Hyperlipidemia, unspecified: Secondary | ICD-10-CM | POA: Diagnosis not present

## 2015-05-26 MED ORDER — POTASSIUM CHLORIDE CRYS ER 20 MEQ PO TBCR: 20.0000 meq | EXTENDED_RELEASE_TABLET | Freq: Two times a day (BID) | ORAL | Status: DC

## 2015-05-26 MED ORDER — RANITIDINE HCL 150 MG PO TABS: 150.0000 mg | ORAL_TABLET | Freq: Two times a day (BID) | ORAL | Status: DC

## 2015-05-26 NOTE — Progress Notes (Signed)
Name: Tracy [name]   MRN: [id number]    DOB: 17-Jul-1970   Date:05/26/2015       Progress Note  Subjective  Chief Complaint  Chief Complaint  Patient presents with  . Follow-up    patient is here for her 6851-month f/u, Rheumatologist wanted Dr. Carlynn PurlSowles to review and discuss low electrolytes. Needs Vitamin D checked. Wanted to get second option of MRI results.   . Hyperlipidemia    Muscle weakness all over    HPI  Connective Tissue Disorder/CFS/Sjogreens Disease: she sees Dr. Janene HarveyKlett in [place] . She is feeling very tired, on Oxycodone for pain control and now seeing Dr. Wayland DenisEisingier at the same office, for pain management.  She has skin photosensitivity, dry mouth, dry eyes, muscles aches, joint aches. Feels tired all the time. Still on Plaquenil.   Gastritis: had EGD and colonoscopy in 02/2015 for recurrent thrush and dysphagia. She was diagnosed with diverticulosis and gastritis. 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 Denies polyphagia, or polyuria. Drinks water all the time because of Sjogrens. She had a recent glucose of 117, ordered by Rheumatologist  Hyperlipidemia: taking Pravastatin and is compliant with medication. No chest pain or palpitation  DDD and herniated disck disease: recent MRI showed some herniated disk , she also has DDD of cervical spine, recurrent falls secondary to left left locking up, also has tingling of both feet and also both hands. Symptoms worse when raises both arms. She has a history of B12 deficiency and we will recheck levels. Having epidural injections but still has back pain.    Patient Active Problem List   Diagnosis Date Noted  . Degenerative disc disease, lumbar 05/26/2015  . Lumbar herniated disc 05/26/2015  . Connective tissue disorder (HCC) 12/09/2014  . 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 Fibromyalgia  12/09/2014  . Abnormal ECG 12/08/2014  . Abnormal antinuclear antibody titer 12/08/2014  . Anxiety and depression 12/08/2014  . Edema leg 12/08/2014  . 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 Dyslipidemia 12/08/2014  . Family history of breast cancer 12/08/2014  . H/O: hysterectomy 12/08/2014  . Dysmetabolic syndrome 12/08/2014  . Obesity (BMI 30-39.9) 12/08/2014  . Tobacco abuse 12/08/2014  . Disorder of tendon 12/08/2014  . Sjogren's syndrome (HCC) 12/08/2014  . Allergic rhinitis, seasonal 12/08/2014  . Snores 12/08/2014  . Tenosynovitis of thumb 12/02/2014    Past Surgical History  Procedure Laterality Date  . Abdominal hysterectomy    . Cholecystectomy    . Nasal sinus surgery    . Tubal ligation    . Endometrial ablation      ovarian cyst removal  . Breast reduction surgery Bilateral 2000    [facility]    Family History  Problem Relation Age of Onset  . Hypertension Mother   . Allergic rhinitis Mother   . Hypertension Father   . Alcohol abuse Father   . Heart disease Father   . Alcohol abuse Brother   . Hypertension Brother     Social History   Social History  . Marital Status: Married    Spouse Name: N/A  . Number of Children: N/A  . Years of Education: N/A   Occupational History  . Not on file.   Social History Main Topics  . Smoking status: Current Every Day Smoker --  0.75 packs/day for 26 years    Types: Cigarettes    Start date: 12/08/1988  . Smokeless tobacco: Never Used  . Alcohol Use: No  . Drug Use: No  . Sexual Activity:    Partners: Male    Birth Control/ Protection: Other-see comments     Comment: Hysterectomy   Other Topics Concern  . Not on file   Social History Narrative     Current outpatient prescriptions:  .  amitriptyline (ELAVIL) 25 MG tablet, Take by mouth., Disp: , Rfl:  .  aspirin 81 MG tablet, Take by mouth.,  Disp: , Rfl:  .  betamethasone dipropionate (DIPROLENE) 0.05 % cream, BETAMETHASONE DIPROPIONATE, 0.05% (External Cream) - Historical Medication  apply twice a day as needed (0.05 %) Active Comments: Medication taken as needed. , Disp: , Rfl:  .  Biotin (BIOTIN 5000) 5 MG CAPS, Take by mouth., Disp: , Rfl:  .  Cholecalciferol (VITAMIN D) 2000 UNITS tablet, Take by mouth., Disp: , Rfl:  .  dextroamphetamine (DEXTROSTAT) 10 MG tablet, TK 1 T PO TID, Disp: , Rfl: 0 .  DULoxetine (CYMBALTA) 30 MG capsule, Take by mouth., Disp: , Rfl:  .  estradiol (ESTRACE) 1 MG tablet, Take 1 tablet (1 mg total) by mouth daily., Disp: 90 tablet, Rfl: 4 .  furosemide (LASIX) 40 MG tablet, Take by mouth., Disp: , Rfl:  .  hydroxychloroquine (PLAQUENIL) 200 MG tablet, Take by mouth., Disp: , Rfl:  .  Multiple Vitamins-Minerals (MULTI VITAMIN/MINERALS) TABS, Take by mouth., Disp: , Rfl:  .  mycophenolate (CELLCEPT) 500 MG tablet, Take by mouth., Disp: , Rfl:  .  nabumetone (RELAFEN) 750 MG tablet, Take by mouth., Disp: , Rfl:  .  Oxycodone HCl 10 MG TABS, Take 1 tablet (10 mg total) by mouth 4 (four) times daily., Disp: 120 tablet, Rfl: 0 .  potassium chloride SA (K-DUR,KLOR-CON) 20 MEQ tablet, Take 1 tablet (20 mEq total) by mouth 2 (two) times daily., Disp: 180 tablet, Rfl: 0 .  pravastatin (PRAVACHOL) 20 MG tablet, TAKE 1 TABLET EVERY EVENING FOR CHOLESTEROL, Disp: 90 tablet, Rfl: 1 .  ranitidine (ZANTAC) 150 MG tablet, Take 1 tablet (150 mg total) by mouth 2 (two) times daily., Disp: 180 tablet, Rfl: 0 .  tiZANidine (ZANAFLEX) 2 MG tablet, , Disp: , Rfl:   Allergies  Allergen Reactions  . Gabapentin Other (See Comments) and Swelling  . Meperidine   . Tramadol      ROS  Ten systems reviewed and is negative except as mentioned in HPI   Objective  Filed Vitals:   05/26/15 0902  Pulse: 96  Temp: 98.2 F (36.8 C)  TempSrc: Oral  Resp: 16  Height:  (1.6 m)  Weight: 211 lb 6.4 oz (95.89 kg)  SpO2:  99%    Body mass index is 37.46 kg/(m^2).  Physical Exam  Constitutional: Patient appears well-developed and well-nourished. Obese No distress.  HEENT: head atraumatic, normocephalic, pupils equal and reactive to light, neck supple, throat within normal limits, oral mucosa is dry Cardiovascular: Normal rate, regular rhythm and normal heart sounds.  No murmur heard. No BLE edema. Pulmonary/Chest: Effort normal and breath sounds normal. No respiratory distress. Abdominal: Soft.  There is no tenderness. Psychiatric: Patient has a normal mood and affect. behavior is normal. Judgment and thought content normal. Muscular Skeletal: negative straight leg raise, mild pain during palpation of lumbar spine  Recent Results (from the past 2160 hour(s))  CBC and differential  Status: None   Collection Time: 04/23/15 12:00 AM  Result Value Ref Range   Hemoglobin 15.3 12.0 - 16.0 g/dL   HCT 44 36 - 46 %   Platelets 295 150 - 399 K/L   WBC 8.0 10^3/mL  Basic metabolic panel     Status: None   Collection Time: 04/23/15 12:00 AM  Result Value Ref Range   Glucose 117 mg/dL    AVW0/9: Depression screen [facility] 2/9 05/26/2015 12/09/2014  Decreased Interest 0 0  Down, Depressed, Hopeless 0 0  PHQ - 2 Score 0 0    Fall Risk: Fall Risk  05/26/2015 12/09/2014  Falls in the past year? No Yes  Number falls in past yr: - 2 or more  Injury with Fall? - Yes    Functional Status Survey: Is the patient deaf or have difficulty hearing?: No Does the patient have difficulty seeing, even when wearing glasses/contacts?: No Does the patient have difficulty concentrating, remembering, or making decisions?: No Does the patient have difficulty walking or climbing stairs?: No Does the patient have difficulty dressing or bathing?: No Does the patient have difficulty doing errands alone such as visiting a doctor's office or shopping?: No    Assessment & Plan   1. Degenerative disc disease, lumbar  Discussed  asking pain clinic to refer her to a neuro-surgeon for a second opinion  2. Lumbar herniated disc  May need second opinion  3. Dyslipidemia  Continue pravastatin   4. Dysmetabolic syndrome  - Hemoglobin A1c  5. Connective tissue disorder (HCC)  Continue follow up with Dr. Janene Harvey  6. Hypokalemia  - Potassium - potassium chloride SA (K-DUR,KLOR-CON) 20 MEQ tablet; Take 1 tablet (20 mEq total) by mouth 2 (two) times daily.  Dispense: 180 tablet; Refill: 0  7. Sjogren's syndrome (HCC)  Continue follow up with dentist and keep mouth moist  8. Gastritis  - ranitidine (ZANTAC) 150 MG tablet; Take 1 tablet (150 mg total) by mouth 2 (two) times daily.  Dispense: 180 tablet; Refill: 0  9. Paresthesia  - Vitamin B12

## 2015-05-28 ENCOUNTER — Encounter: Payer: Self-pay | Admitting: Family Medicine

## 2015-06-11 ENCOUNTER — Ambulatory Visit: Admitting: Family Medicine

## 2015-06-16 ENCOUNTER — Encounter: Payer: Self-pay | Admitting: Family Medicine

## 2015-08-06 ENCOUNTER — Other Ambulatory Visit: Payer: Self-pay | Admitting: Family Medicine

## 2015-08-06 NOTE — Telephone Encounter (Signed)
Patient requesting refill. 

## 2015-08-07 ENCOUNTER — Other Ambulatory Visit: Payer: Self-pay | Admitting: Family Medicine

## 2015-09-06 ENCOUNTER — Telehealth: Payer: Self-pay | Admitting: Family Medicine

## 2015-09-06 NOTE — Telephone Encounter (Signed)
ERRENOUS °

## 2015-09-08 LAB — BASIC METABOLIC PANEL
BUN: 8 mg/dL (ref 4–21)
CREATININE: 0.8 mg/dL (ref 0.5–1.1)
GLUCOSE: 159 mg/dL
POTASSIUM: 3.4 mmol/L (ref 3.4–5.3)
Sodium: 139 mmol/L (ref 137–147)

## 2015-09-08 LAB — CBC AND DIFFERENTIAL
HCT: 44 % (ref 36–46)
HEMOGLOBIN: 14.8 g/dL (ref 12.0–16.0)
Neutrophils Absolute: 4 /uL
Platelets: 301 10*3/uL (ref 150–399)
WBC: 6.3 10*3/mL

## 2015-09-08 LAB — HEPATIC FUNCTION PANEL
ALT: 16 U/L (ref 7–35)
AST: 18 U/L (ref 13–35)
Alkaline Phosphatase: 80 U/L (ref 25–125)
Bilirubin, Total: 0.2 mg/dL

## 2015-09-13 ENCOUNTER — Encounter: Payer: Self-pay | Admitting: Family Medicine

## 2015-11-06 ENCOUNTER — Other Ambulatory Visit: Payer: Self-pay | Admitting: Family Medicine

## 2015-11-26 ENCOUNTER — Ambulatory Visit: Admitting: Family Medicine

## 2015-12-11 ENCOUNTER — Other Ambulatory Visit: Payer: Self-pay | Admitting: Family Medicine

## 2015-12-11 DIAGNOSIS — E8940 Asymptomatic postprocedural ovarian failure: Secondary | ICD-10-CM

## 2015-12-16 HISTORY — PX: JOINT REPLACEMENT: SHX530

## 2015-12-16 HISTORY — PX: CARPAL TUNNEL RELEASE: SHX101

## 2015-12-21 ENCOUNTER — Telehealth: Payer: Self-pay | Admitting: Family Medicine

## 2015-12-21 NOTE — Telephone Encounter (Signed)
Patient requesting refill of Pravastatin to [facility].

## 2015-12-22 NOTE — Telephone Encounter (Signed)
Patient informed prescription has been sent to pharmacy, she will call back to schedule appointment.

## 2015-12-28 LAB — HEPATIC FUNCTION PANEL
ALK PHOS: 105 U/L (ref 25–125)
ALT: 11 U/L (ref 7–35)
AST: 14 U/L (ref 13–35)

## 2015-12-28 LAB — CBC AND DIFFERENTIAL
HEMATOCRIT: 44 % (ref 36–46)
HEMOGLOBIN: 15.2 g/dL (ref 12.0–16.0)
PLATELETS: 281 10*3/uL (ref 150–399)
WBC: 5.9 10^3/mL

## 2015-12-28 LAB — BASIC METABOLIC PANEL
BUN: 12 mg/dL (ref 4–21)
Creatinine: 0.9 mg/dL (ref 0.5–1.1)
Glucose: 123 mg/dL
Potassium: 3.9 mmol/L (ref 3.4–5.3)
SODIUM: 137 mmol/L (ref 137–147)

## 2015-12-28 LAB — TSH: TSH: 3.15 u[IU]/mL (ref 0.41–5.90)

## 2016-01-03 ENCOUNTER — Other Ambulatory Visit: Payer: Self-pay | Admitting: Family Medicine

## 2016-01-03 DIAGNOSIS — Z1231 Encounter for screening mammogram for malignant neoplasm of breast: Secondary | ICD-10-CM

## 2016-01-06 ENCOUNTER — Ambulatory Visit: Admission: RE | Admit: 2016-01-06 | Source: Ambulatory Visit

## 2016-01-26 ENCOUNTER — Other Ambulatory Visit: Payer: Self-pay | Admitting: Family Medicine

## 2016-01-26 ENCOUNTER — Ambulatory Visit (INDEPENDENT_AMBULATORY_CARE_PROVIDER_SITE_OTHER): Admitting: Family Medicine

## 2016-01-26 ENCOUNTER — Encounter: Payer: Self-pay | Admitting: Family Medicine

## 2016-01-26 VITALS — BP 122/84 | HR 120 | Temp 98.7°F | Resp 16 | Ht 63.0 in | Wt 202.2 lb

## 2016-01-26 DIAGNOSIS — Z23 Encounter for immunization: Secondary | ICD-10-CM

## 2016-01-26 DIAGNOSIS — E8881 Metabolic syndrome: Secondary | ICD-10-CM | POA: Diagnosis not present

## 2016-01-26 DIAGNOSIS — Z1231 Encounter for screening mammogram for malignant neoplasm of breast: Secondary | ICD-10-CM

## 2016-01-26 DIAGNOSIS — Z1239 Encounter for other screening for malignant neoplasm of breast: Secondary | ICD-10-CM

## 2016-01-26 DIAGNOSIS — Z01419 Encounter for gynecological examination (general) (routine) without abnormal findings: Secondary | ICD-10-CM | POA: Diagnosis not present

## 2016-01-26 DIAGNOSIS — E785 Hyperlipidemia, unspecified: Secondary | ICD-10-CM | POA: Diagnosis not present

## 2016-01-26 DIAGNOSIS — L906 Striae atrophicae: Secondary | ICD-10-CM

## 2016-01-26 DIAGNOSIS — R5383 Other fatigue: Secondary | ICD-10-CM | POA: Diagnosis not present

## 2016-01-26 DIAGNOSIS — F331 Major depressive disorder, recurrent, moderate: Secondary | ICD-10-CM | POA: Diagnosis not present

## 2016-01-26 MED ORDER — PRAVASTATIN SODIUM 20 MG PO TABS: ORAL_TABLET | ORAL | 1 refills | Status: DC

## 2016-01-26 MED ORDER — DULOXETINE HCL 60 MG PO CPEP: 120.0000 mg | ORAL_CAPSULE | Freq: Every day | ORAL | 1 refills | Status: DC

## 2016-01-26 NOTE — Progress Notes (Signed)
Name: Tracy [name]   MRN: [id number]    DOB: 1970-12-15   Date:01/26/2016       Progress Note  Subjective  Chief Complaint  Chief Complaint  Patient presents with  . Annual Exam  . Flu Vaccine    HPI  Well Woman: she is due for mammogram, pap smear is up to date, she has vaginal dryness because of Sjogren's  Major Depression: she is taking Duloxetine 90 mg but still feels very tired,  overwhelmed, crying spells, weight loss, snappy. Used to take Prozac in the past and would like to go back on it, however depression worse since she lost her job 10/22/2015 because she exhausted her FMLA time off. She denies suicidal thoughts or ideation.   FMS/Connective Tissue Disorder: she is always in pain, she goes to pain clinic at [facility] also Rheumatologist, she has pain all the time, pain level right now is 6/10.  Other fatigue: obesity, low potassium, also has striae, discussed Cushing's, and we will refer her to endo for further work up.  Metabolic Syndrome: she is worried that numbers could be worse, she denies polyphagia, polydipsia or polyuria. Not following a diabetic diet, and off Metformin. We will recheck labs before we resume medication  Dyslipidemia: she is taking Pravastatin.     Patient Active Problem List   Diagnosis Date Noted  . Degenerative disc disease, lumbar 05/26/2015  . Lumbar herniated disc 05/26/2015  . Connective tissue disorder (HCC) 12/09/2014  . 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 Fibromyalgia 12/09/2014  . Abnormal ECG 12/08/2014  . Abnormal antinuclear antibody titer 12/08/2014  . Anxiety and depression 12/08/2014  . Edema leg 12/08/2014  . 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 CFIDS (chronic fatigue and immune dysfunction syndrome) (HCC) 12/08/2014  . 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 Dyslipidemia 12/08/2014  . Family history of breast cancer 12/08/2014  . H/O: hysterectomy 12/08/2014  . Dysmetabolic syndrome 12/08/2014  . Obesity (BMI  30-39.9) 12/08/2014  . Tobacco abuse 12/08/2014  . Disorder of tendon 12/08/2014  . Sjogren's syndrome (HCC) 12/08/2014  . Allergic rhinitis, seasonal 12/08/2014  . Snores 12/08/2014  . Tenosynovitis of thumb 12/02/2014    Past Surgical History:  Procedure Laterality Date  . ABDOMINAL HYSTERECTOMY    . BREAST REDUCTION SURGERY Bilateral 2000   [facility]  . CARPAL TUNNEL RELEASE Right 12/16/2015  . CHOLECYSTECTOMY    . ENDOMETRIAL ABLATION     ovarian cyst removal  . JOINT REPLACEMENT Right 12/16/2015   right thumb  . NASAL SINUS SURGERY    . TUBAL LIGATION      Family History  Problem Relation Age of Onset  . Hypertension Mother   . Allergic rhinitis Mother   . Hypertension Father   . Alcohol abuse Father   . Heart disease Father   . Alcohol abuse Brother   . Hypertension Brother     Social History   Social History  . Marital status: Married    Spouse name: Baldo AshCarl  . Number of children: 1  . Years of education: N/A   Occupational History  . Not on file.   Social History Main Topics  . Smoking status: Current Every Day Smoker    Packs/day: 0.75    Years: 26.00    Types: Cigarettes    Start date: 12/08/1988  . Smokeless tobacco: Never Used  . Alcohol use No  . Drug use: No  . Sexual activity: Yes    Partners: Male    Birth control/ protection:  Other-see comments     Comment: Hysterectomy   Other Topics Concern  . Not on file   Social History Narrative   Married, she has a grown daughter that has autism.    She lost her job at [employer] on 10/22/2015 ( worker there for 10 years), after she used all her FMLA for medical problems. She was fired the day after her short term disability ran out and she was getting ready to have carpal tunnel repair and right thumb replacement.      Current Outpatient Prescriptions:  .  Chlorzoxazone (LORZONE) 375 MG TABS, Take 375 mg by mouth 3 (three) times daily., Disp: , Rfl:  .  cycloSPORINE (RESTASIS) 0.05 % ophthalmic  emulsion, 1 drop 2 (two) times daily., Disp: , Rfl:  .  nabumetone (RELAFEN) 750 MG tablet, Take by mouth., Disp: , Rfl:  .  amitriptyline (ELAVIL) 25 MG tablet, Take by mouth., Disp: , Rfl:  .  aspirin 81 MG tablet, Take by mouth., Disp: , Rfl:  .  betamethasone dipropionate (DIPROLENE) 0.05 % cream, BETAMETHASONE DIPROPIONATE, 0.05% (External Cream) - Historical Medication  apply twice a day as needed (0.05 %) Active Comments: Medication taken as needed. , Disp: , Rfl:  .  Biotin (BIOTIN 5000) 5 MG CAPS, Take by mouth., Disp: , Rfl:  .  Cholecalciferol (VITAMIN D) 2000 UNITS tablet, Take by mouth., Disp: , Rfl:  .  dextroamphetamine (DEXTROSTAT) 10 MG tablet, TK 1 T PO TID, Disp: , Rfl: 0 .  DULoxetine (CYMBALTA) 60 MG capsule, Take 2 capsules (120 mg total) by mouth daily., Disp: 60 capsule, Rfl: 1 .  estradiol (ESTRACE) 1 MG tablet, TAKE 1 TABLET DAILY, Disp: 90 tablet, Rfl: 0 .  furosemide (LASIX) 40 MG tablet, Take by mouth., Disp: , Rfl:  .  hydroxychloroquine (PLAQUENIL) 200 MG tablet, Take by mouth., Disp: , Rfl:  .  KLOR-CON M20 20 MEQ tablet, TAKE 1 TABLET TWICE A DAY, Disp: 180 tablet, Rfl: 0 .  Multiple Vitamins-Minerals (MULTI VITAMIN/MINERALS) TABS, Take by mouth., Disp: , Rfl:  .  mycophenolate (CELLCEPT) 500 MG tablet, Take 500 mg by mouth 2 (two) times daily. , Disp: , Rfl:  .  Oxycodone HCl 10 MG TABS, Take 1 tablet (10 mg total) by mouth 4 (four) times daily., Disp: 120 tablet, Rfl: 0 .  pravastatin (PRAVACHOL) 20 MG tablet, TAKE 1 TABLET EVERY EVENING FOR CHOLESTEROL, Disp: 90 tablet, Rfl: 1 .  ranitidine (ZANTAC) 150 MG tablet, TAKE 1 TABLET TWICE A DAY, Disp: 180 tablet, Rfl: 1 .  tiZANidine (ZANAFLEX) 2 MG tablet, , Disp: , Rfl:   Allergies  Allergen Reactions  . Gabapentin Other (See Comments) and Swelling  . Meperidine   . Tramadol      ROS  Constitutional: Negative for fever, positive for weight change.  Respiratory: Negative for cough and shortness of  breath.   Cardiovascular: Negative for chest pain or palpitations.  Gastrointestinal: Negative for abdominal pain, no bowel changes.  Musculoskeletal: Negative for gait problem, positive for  joint swelling.  Skin: Negative for rash.  Neurological: Negative for headache but feels very dizzy when she gets up in am.   No other specific complaints in a complete review of systems (except as listed in HPI above).  Objective  Vitals:   01/26/16 1129  BP: 122/84  Pulse: (!) 120  Resp: 16  Temp: 98.7 F (37.1 C)  TempSrc: Oral  SpO2: 96%  Weight: 202 lb 3 oz (91.7 kg)  Height:  5\' 3"  (1.6 m)    Body mass index is 35.82 kg/m.  Physical Exam  Constitutional: Patient appears well-developed and obese  No distress.  HENT: Head: Normocephalic and atraumatic. Ears: B TMs ok, no erythema or effusion; Nose: Nose normal. Mouth/Throat: Oropharynx is clear and moist. No oropharyngeal exudate.  Eyes: Conjunctivae and EOM are normal. Pupils are equal, round, and reactive to light. No scleral icterus.  Neck: Normal range of motion. Neck supple. No JVD present. No thyromegaly present.  Cardiovascular: Normal rate, regular rhythm and normal heart sounds.  No murmur heard. No BLE edema. Pulmonary/Chest: Effort normal and breath sounds normal. No respiratory distress. Abdominal: Soft. Bowel sounds are normal, no distension. There is no tenderness. no masses Breast: no lumps or masses, no nipple discharge or rashes FEMALE GENITALIA:  External genitalia some atrophy  External urethra normal Pelvic not done RECTAL: not done Musculoskeletal: Normal range of motion, no joint effusions. No gross deformities Neurological: he is alert and oriented to person, place, and time. No cranial nerve deficit. Coordination, balance, strength, speech and gait are normal.  Skin: Skin is warm and dry.she has striae on abdomen area, soft hump on nuchal area Psychiatric: Patient has a normal mood and affect. behavior is  normal. Judgment and thought content normal.  Recent Results (from the past 2160 hour(s))  CBC and differential     Status: None   Collection Time: 12/28/15 12:00 AM  Result Value Ref Range   Hemoglobin 15.2 12.0 - 16.0 g/dL   HCT 44 36 - 46 %   Platelets 281 150 - 399 K/L   WBC 5.9 10^3/mL  Basic metabolic panel     Status: None   Collection Time: 12/28/15 12:00 AM  Result Value Ref Range   Glucose 123 mg/dL   BUN 12 4 - 21 mg/dL   Creatinine 0.9 0.5 - 1.1 mg/dL   Potassium 3.9 3.4 - 5.3 mmol/L   Sodium 137 137 - 147 mmol/L  Hepatic function panel     Status: None   Collection Time: 12/28/15 12:00 AM  Result Value Ref Range   Alkaline Phosphatase 105 25 - 125 U/L   ALT 11 7 - 35 U/L   AST 14 13 - 35 U/L  TSH     Status: None   Collection Time: 12/28/15 12:00 AM  Result Value Ref Range   TSH 3.15 0.41 - 5.90 uIU/mL     PHQ2/9: Depression screen [facility] 01/26/2016 05/26/2015 12/09/2014  Decreased Interest 2 0 0  Down, Depressed, Hopeless 2 0 0  PHQ - 2 Score 4 0 0  Altered sleeping 2 - -  Tired, decreased energy 2 - -  Change in appetite 0 - -  Feeling bad or failure about yourself  0 - -  Trouble concentrating 0 - -  Moving slowly or fidgety/restless 0 - -  Suicidal thoughts 0 - -  PHQ-9 Score 8 - -     Fall Risk: Fall Risk  01/26/2016 05/26/2015 12/09/2014  Falls in the past year? Yes No Yes  Number falls in past yr: 2 or more - 2 or more  Injury with Fall? Yes - Yes  Risk Factor Category  High Fall Risk - -  Follow up Falls evaluation completed - -     Functional Status Survey: Is the patient deaf or have difficulty hearing?: No Does the patient have difficulty seeing, even when wearing glasses/contacts?: No Does the patient have difficulty concentrating, remembering, or making decisions?: No  Does the patient have difficulty walking or climbing stairs?: No Does the patient have difficulty dressing or bathing?: No Does the patient have difficulty doing  errands alone such as visiting a doctor's office or shopping?: No    Assessment & Plan  1. Well woman exam  Discussed importance of 150 minutes of physical activity weekly, eat two servings of fish weekly, eat one serving of tree nuts ( cashews, pistachios, pecans, almonds.Marland Kitchen) every other day, eat 6 servings of fruit/vegetables daily and drink plenty of water and avoid sweet beverages.   2. Need for influenza vaccination  - Flu Vaccine QUAD 36+ mos PF IM (Fluarix & Fluzone Quad PF)  3. Dysmetabolic syndrome  - Hemoglobin A1c  4. Dyslipidemia  - Lipid panel - pravastatin (PRAVACHOL) 20 MG tablet; TAKE 1 TABLET EVERY EVENING FOR CHOLESTEROL  Dispense: 90 tablet; Refill: 1  5. Moderate episode of recurrent major depressive disorder [facility])  She is very depressed since she lost her job in August. We will increase Cymbalta from 90 mg to 120 mg daily, advised referral to psychiatrist.  - DULoxetine (CYMBALTA) 60 MG capsule; Take 2 capsules (120 mg total) by mouth daily.  Dispense: 60 capsule; Refill: 1  6. Breast cancer screening  - MM Digital Screening; Future  7. Other fatigue  - Ambulatory referral to Endocrinology  8. Striae  - Ambulatory referral to Endocrinology

## 2016-01-27 ENCOUNTER — Encounter: Payer: Self-pay | Admitting: Family Medicine

## 2016-02-02 ENCOUNTER — Encounter (HOSPITAL_COMMUNITY): Payer: Self-pay

## 2016-02-04 ENCOUNTER — Other Ambulatory Visit: Payer: Self-pay | Admitting: Family Medicine

## 2016-02-23 ENCOUNTER — Ambulatory Visit: Payer: Self-pay | Admitting: Licensed Clinical Social Worker

## 2016-02-29 ENCOUNTER — Other Ambulatory Visit: Payer: Self-pay | Admitting: Family Medicine

## 2016-02-29 DIAGNOSIS — E785 Hyperlipidemia, unspecified: Secondary | ICD-10-CM

## 2016-02-29 LAB — HEMOGLOBIN A1C
ESTIMATED AVERAGE GLUCOSE: 111 mg/dL
HEMOGLOBIN A1C: 5.5 % (ref 4.8–5.6)

## 2016-02-29 LAB — LIPID PANEL
CHOLESTEROL TOTAL: 215 mg/dL — AB (ref 100–199)
Chol/HDL Ratio: 4.1 ratio units (ref 0.0–4.4)
HDL: 53 mg/dL (ref >39)
LDL Calculated: 122 mg/dL — ABNORMAL HIGH (ref 0–99)
Triglycerides: 202 mg/dL — ABNORMAL HIGH (ref 0–149)
VLDL Cholesterol Cal: 40 mg/dL (ref 5–40)

## 2016-02-29 MED ORDER — PRAVASTATIN SODIUM 40 MG PO TABS: ORAL_TABLET | ORAL | 1 refills | Status: DC

## 2016-03-16 ENCOUNTER — Other Ambulatory Visit: Payer: Self-pay | Admitting: Family Medicine

## 2016-03-16 DIAGNOSIS — E8940 Asymptomatic postprocedural ovarian failure: Secondary | ICD-10-CM

## 2016-03-16 NOTE — Telephone Encounter (Signed)
Patient requesting refill of Estradiol to [facility].

## 2016-04-18 HISTORY — PX: CARPAL TUNNEL RELEASE: SHX101

## 2016-04-28 ENCOUNTER — Ambulatory Visit (INDEPENDENT_AMBULATORY_CARE_PROVIDER_SITE_OTHER): Admitting: Family Medicine

## 2016-04-28 ENCOUNTER — Encounter: Payer: Self-pay | Admitting: Family Medicine

## 2016-04-28 VITALS — BP 108/62 | HR 103 | Temp 97.5°F | Resp 18 | Ht 63.0 in | Wt 209.7 lb

## 2016-04-28 DIAGNOSIS — Z9889 Other specified postprocedural states: Secondary | ICD-10-CM | POA: Diagnosis not present

## 2016-04-28 DIAGNOSIS — M5126 Other intervertebral disc displacement, lumbar region: Secondary | ICD-10-CM

## 2016-04-28 DIAGNOSIS — R5382 Chronic fatigue, unspecified: Secondary | ICD-10-CM

## 2016-04-28 DIAGNOSIS — E785 Hyperlipidemia, unspecified: Secondary | ICD-10-CM | POA: Diagnosis not present

## 2016-04-28 DIAGNOSIS — J302 Other seasonal allergic rhinitis: Secondary | ICD-10-CM | POA: Diagnosis not present

## 2016-04-28 DIAGNOSIS — E790 Hyperuricemia without signs of inflammatory arthritis and tophaceous disease: Secondary | ICD-10-CM

## 2016-04-28 DIAGNOSIS — M3503 Sicca syndrome with myopathy: Secondary | ICD-10-CM

## 2016-04-28 DIAGNOSIS — D8989 Other specified disorders involving the immune mechanism, not elsewhere classified: Secondary | ICD-10-CM

## 2016-04-28 DIAGNOSIS — M797 Fibromyalgia: Secondary | ICD-10-CM | POA: Diagnosis not present

## 2016-04-28 DIAGNOSIS — E8881 Metabolic syndrome: Secondary | ICD-10-CM

## 2016-04-28 DIAGNOSIS — G9332 Myalgic encephalomyelitis/chronic fatigue syndrome: Secondary | ICD-10-CM

## 2016-04-28 DIAGNOSIS — K5909 Other constipation: Secondary | ICD-10-CM | POA: Diagnosis not present

## 2016-04-28 DIAGNOSIS — M359 Systemic involvement of connective tissue, unspecified: Secondary | ICD-10-CM

## 2016-04-28 DIAGNOSIS — F331 Major depressive disorder, recurrent, moderate: Secondary | ICD-10-CM

## 2016-04-28 MED ORDER — FLUTICASONE PROPIONATE 50 MCG/ACT NA SUSP: 2.0000 | Freq: Every day | NASAL | 1 refills | Status: DC

## 2016-04-28 MED ORDER — LORATADINE 10 MG PO TABS
10.0000 mg | ORAL_TABLET | Freq: Every day | ORAL | 0 refills | Status: DC
Start: 2016-04-28 — End: 2016-09-15

## 2016-04-28 MED ORDER — DULOXETINE HCL 60 MG PO CPEP: 120.0000 mg | ORAL_CAPSULE | Freq: Every day | ORAL | 1 refills | Status: DC

## 2016-04-28 NOTE — Progress Notes (Signed)
Name: Tracy [name]   MRN: [id number]    DOB: December 09, 1970   Date:04/28/2016       Progress Note  Subjective  Chief Complaint  Chief Complaint  Patient presents with  . Medication Refill    3 month F/U  . Fatigue    Tired all the time, would like to discuss sleep study done at home  . Hyperlipidemia    Muscle cramps all the time  . Gastroesophageal Reflux    Worst the past month at night, takes medication daily  . DDD    Still seeing pain management doctor and Neurologist for her symptoms  . Peripheral Neuropathy    Increased Cymbalta last visit and has helped symptoms    HPI  Major Depression: she is taking Duloxetine 120 mg daily, increased dose Fall 2017, she is doing better no crying spells, fatigue is stable, no longer as snappy. Used to take Prozac in the past and would like to go back on it, however depression worse since she lost her job 10/22/2015 because she exhausted her FMLA time off. She denies suicidal thoughts or ideation.   FMS/Connective Tissue Disorder: she is always in pain, she goes to pain clinic at [facility], she has pain all the time, pain level right now is 4/10, worse pain is on hand from surgery 8/10  Other fatigue: obesity, low potassium, also has striae, discussed Cushing's, seen by Endo and repeat work up was negative. No further evaluation required at this time  Metabolic Syndrome: she denies polyphagia, polydipsia or polyuria. Not following a diabetic diet, and off Metformin. Last hgbA1C was at goal  Dyslipidemia: she is taking Pravastatin, dose increased on her last visit, no side effects  DDD with radiculitis: referred by Rheumatologist to neurologist because of paresthesia on fingers and toes, but after evaluation likely from radiculitis. She is taking higher dose of Cymbalta and is doing better.   Sjogrens: sees Rheumatologist, still has myalgias, dry mouth, dry eyes and daily fatigue  AR: she has noticed  worsening of post-nasal drainage, nasal congestion and occasionally rhinorrhea, we will resume Loratadine and nasal spray   Patient Active Problem List   Diagnosis Date Noted  . Elevated uric acid in blood 04/28/2016  . Degenerative disc disease, lumbar 05/26/2015  . Lumbar herniated disc 05/26/2015  . Connective tissue disorder ([place]) 12/09/2014  . 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 Fibromyalgia 12/09/2014  . Abnormal ECG 12/08/2014  . Abnormal antinuclear antibody titer 12/08/2014  . Major depression, chronic 12/08/2014  . Edema leg 12/08/2014  . 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 CFIDS (chronic fatigue and immune dysfunction syndrome) ([place]) 12/08/2014  . 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 Dyslipidemia 12/08/2014  . Family history of breast cancer 12/08/2014  . H/O: hysterectomy 12/08/2014  . Dysmetabolic syndrome 40/34/7425  . Obesity (BMI 30-39.9) 12/08/2014  . Tobacco abuse 12/08/2014  . Disorder of tendon 12/08/2014  . Sjogren's syndrome ([place]) 12/08/2014  . Allergic rhinitis, seasonal 12/08/2014  . Snores 12/08/2014  . Tenosynovitis of thumb 12/02/2014    Past Surgical History:  Procedure Laterality Date  . ABDOMINAL HYSTERECTOMY    . BREAST REDUCTION SURGERY Bilateral 2000   [facility]  . CARPAL TUNNEL RELEASE Right 12/16/2015  . CARPAL TUNNEL RELEASE Left 04/18/2016   also left thumb replacement  . CHOLECYSTECTOMY    . ENDOMETRIAL ABLATION     ovarian cyst removal  . JOINT REPLACEMENT Right 12/16/2015   right thumb  . NASAL SINUS SURGERY    .  TUBAL LIGATION      Family History  Problem Relation Age of Onset  . Hypertension Mother   . Allergic rhinitis Mother   . Hypertension Father   . Alcohol abuse Father   . Heart disease Father   . Alcohol abuse Brother   . Hypertension Brother     Social History   Social History  . Marital status: Married    Spouse name: Glendell Docker  . Number of children: 1  . Years of education: N/A   Occupational History  .  Not on file.   Social History Main Topics  . Smoking status: Current Every Day Smoker    Packs/day: 0.75    Years: 26.00    Types: Cigarettes    Start date: 12/08/1988  . Smokeless tobacco: Never Used  . Alcohol use No  . Drug use: No  . Sexual activity: Yes    Partners: Male    Birth control/ protection: Other-see comments     Comment: Hysterectomy   Other Topics Concern  . Not on file   Social History Narrative   Married, she has a grown daughter that has autism.    She lost her job at [employer] on 10/22/2015 ( worker there for 10 years), after she used all her FMLA for medical problems. She was fired the day after her short term disability ran out and she was getting ready to have carpal tunnel repair and right thumb replacement.      Current Outpatient Prescriptions:  .  amitriptyline (ELAVIL) 25 MG tablet, Take by mouth., Disp: , Rfl:  .  aspirin 81 MG tablet, Take by mouth., Disp: , Rfl:  .  betamethasone dipropionate (DIPROLENE) 0.05 % cream, BETAMETHASONE DIPROPIONATE, 0.05% (External Cream) - Historical Medication  apply twice a day as needed (0.05 %) Active Comments: Medication taken as needed. , Disp: , Rfl:  .  Biotin (BIOTIN 5000) 5 MG CAPS, Take by mouth., Disp: , Rfl:  .  Chlorzoxazone (LORZONE) 375 MG TABS, Take 375 mg by mouth 3 (three) times daily., Disp: , Rfl:  .  Cholecalciferol (VITAMIN D) 2000 UNITS tablet, Take by mouth., Disp: , Rfl:  .  cyclobenzaprine (FLEXERIL) 5 MG tablet, Take 1 tablet by mouth at bedtime., Disp: , Rfl: 0 .  cycloSPORINE (RESTASIS) 0.05 % ophthalmic emulsion, 1 drop 2 (two) times daily., Disp: , Rfl:  .  dextroamphetamine (DEXTROSTAT) 10 MG tablet, TK 1 T PO TID, Disp: , Rfl: 0 .  Dextroamphetamine Sulfate (DEXTROSTAT PO), Take 1 tablet by mouth 2 (two) times daily. Takes 2 tablets in the morning and 1 in the afternoon, Disp: , Rfl:  .  DULoxetine (CYMBALTA) 60 MG capsule, Take 2 capsules (120 mg total) by mouth daily., Disp: 180  capsule, Rfl: 1 .  estradiol (ESTRACE) 1 MG tablet, TAKE 1 TABLET DAILY, Disp: 90 tablet, Rfl: 0 .  fluconazole (DIFLUCAN) 150 MG tablet, , Disp: , Rfl:  .  furosemide (LASIX) 40 MG tablet, Take by mouth., Disp: , Rfl:  .  hydroxychloroquine (PLAQUENIL) 200 MG tablet, Take by mouth., Disp: , Rfl:  .  KLOR-CON M20 20 MEQ tablet, TAKE 1 TABLET TWICE A DAY, Disp: 180 tablet, Rfl: 0 .  Multiple Vitamins-Minerals (MULTI VITAMIN/MINERALS) TABS, Take by mouth., Disp: , Rfl:  .  nabumetone (RELAFEN) 750 MG tablet, Take by mouth., Disp: , Rfl:  .  NARCAN 4 MG/0.1ML LIQD nasal spray kit, , Disp: , Rfl:  .  Oxycodone HCl 10 MG TABS, Take 1  tablet by mouth 5 (five) times daily., Disp: , Rfl:  .  pravastatin (PRAVACHOL) 40 MG tablet, TAKE 1 TABLET EVERY EVENING FOR CHOLESTEROL, Disp: 90 tablet, Rfl: 1 .  ranitidine (ZANTAC) 150 MG tablet, TAKE 1 TABLET TWICE A DAY, Disp: 180 tablet, Rfl: 1 .  ULORIC 40 MG tablet, , Disp: , Rfl:  .  Vitamin D, Ergocalciferol, (DRISDOL) 50000 units CAPS capsule, , Disp: , Rfl:  .  fluticasone (FLONASE ALLERGY RELIEF) 50 MCG/ACT nasal spray, Place 2 sprays into both nostrils daily., Disp: 48 g, Rfl: 1 .  loratadine (CLARITIN) 10 MG tablet, Take 1 tablet (10 mg total) by mouth daily., Disp: 90 tablet, Rfl: 0  Allergies  Allergen Reactions  . Gabapentin Other (See Comments) and Swelling  . Meperidine   . Tramadol      ROS  Constitutional: Negative for fever , positive for  weight change.  Respiratory: Positive for mild dry cough no shortness of breath.   Cardiovascular: Negative for chest pain or palpitations.  Gastrointestinal: Negative for abdominal pain, no bowel changes.  Musculoskeletal: Negative for gait problem or joint swelling.  Skin: Negative for rash.  Neurological: Negative for dizziness or headache.  No other specific complaints in a complete review of systems (except as listed in HPI above). Objective  Vitals:   04/28/16 1057  BP: 108/62  Pulse:  (!) 103  Resp: 18  Temp: 97.5 F (36.4 C)  TempSrc: Oral  SpO2: 97%  Weight: 209 lb 11.2 oz (95.1 kg)  Height: '5\' 3"'  (1.6 m)    Body mass index is 37.15 kg/m.  Physical Exam  Constitutional: Patient appears well-developed and well-nourished. Obese No distress.  HEENT: head atraumatic, normocephalic, pupils equal and reactive to light, has candy in her mouth ( she has sjogren's and mouth is usually dry)  neck supple, throat within normal limits Cardiovascular: Normal rate, regular rhythm and normal heart sounds.  No murmur heard. No BLE edema. Pulmonary/Chest: Effort normal and breath sounds normal. No respiratory distress. Abdominal: Soft.  There is no tenderness. Psychiatric: Patient has a normal mood and affect. behavior is normal. Judgment and thought content normal. Muscular Skeletal: she has positive trigger points, wearing a brace on left hand/thumb  Recent Results (from the past 2160 hour(s))  HgB A1c     Status: None   Collection Time: 02/28/16 12:58 PM  Result Value Ref Range   Hgb A1c MFr Bld 5.5 4.8 - 5.6 %    Comment:          Pre-diabetes: 5.7 - 6.4          Diabetes: >6.4          Glycemic control for adults with diabetes: <7.0    Est. average glucose Bld gHb Est-mCnc 111 mg/dL  Lipid Profile     Status: Abnormal   Collection Time: 02/28/16 12:58 PM  Result Value Ref Range   Cholesterol, Total 215 (H) 100 - 199 mg/dL   Triglycerides 202 (H) 0 - 149 mg/dL   HDL 53 >39 mg/dL   VLDL Cholesterol Cal 40 5 - 40 mg/dL   LDL Calculated 122 (H) 0 - 99 mg/dL   Chol/HDL Ratio 4.1 0.0 - 4.4 ratio units    Comment:                                   T. Chol/HDL Ratio  Men  Women                               1/2 Avg.Risk  3.4    3.3                                   Avg.Risk  5.0    4.4                                2X Avg.Risk  9.6    7.1                                3X Avg.Risk 23.4   11.0       PHQ2/9: Depression  screen [facility] 2/9 04/28/2016 01/26/2016 05/26/2015 12/09/2014  Decreased Interest 1 2 0 0  Down, Depressed, Hopeless 0 2 0 0  PHQ - 2 Score 1 4 0 0  Altered sleeping 3 2 - -  Tired, decreased energy 3 2 - -  Change in appetite 0 0 - -  Feeling bad or failure about yourself  0 0 - -  Trouble concentrating 0 0 - -  Moving slowly or fidgety/restless 0 0 - -  Suicidal thoughts 0 0 - -  PHQ-9 Score 7 8 - -     Fall Risk: Fall Risk  04/28/2016 01/26/2016 05/26/2015 12/09/2014  Falls in the past year? Yes Yes No Yes  Number falls in past yr: 2 or more 2 or more - 2 or more  Injury with Fall? No Yes - Yes  Risk Factor Category  - High Fall Risk - -  Follow up - Falls evaluation completed - -      Assessment & Plan  1. Dysmetabolic syndrome  We will check fasting insulin on her next visit  2. Dyslipidemia  Continue Pravastatin   3. Moderate episode of recurrent major depressive disorder ([place])  PHQ9 is stable, fatigue is likely secondary to CFS, and problem sleeping is usually to pain.  - DULoxetine (CYMBALTA) 60 MG capsule; Take 2 capsules (120 mg total) by mouth daily.  Dispense: 180 capsule; Refill: 1  4. Connective tissue disorder ([place])  Seeing Rheumatologist   5. 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 History of carpal tunnel repair  Doing well, recovering from surgery   7. Lumbar herniated disc  Continue follow up with pain clinic   8. Fibromyalgia  Continue Cymbalta  9. Elevated uric acid in blood  Started on Uloric   10. Chronic seasonal allergic rhinitis, unspecified trigger  We will resume medication  - loratadine (CLARITIN) 10 MG tablet; Take 1 tablet (10 mg total) by mouth daily.  Dispense: 90 tablet; Refill: 0 - fluticasone (FLONASE ALLERGY RELIEF) 50 MCG/ACT nasal spray; Place 2 sprays into both nostrils daily.  Dispense: 48 g; Refill: 1

## 2016-05-04 ENCOUNTER — Other Ambulatory Visit: Payer: Self-pay | Admitting: Family Medicine

## 2016-05-22 ENCOUNTER — Other Ambulatory Visit: Payer: Self-pay | Admitting: Family Medicine

## 2016-05-22 DIAGNOSIS — E8940 Asymptomatic postprocedural ovarian failure: Secondary | ICD-10-CM

## 2016-06-20 ENCOUNTER — Ambulatory Visit: Admitting: Family Medicine

## 2016-07-12 ENCOUNTER — Ambulatory Visit (INDEPENDENT_AMBULATORY_CARE_PROVIDER_SITE_OTHER): Admitting: Family Medicine

## 2016-07-12 ENCOUNTER — Encounter: Payer: Self-pay | Admitting: Family Medicine

## 2016-07-12 VITALS — BP 118/68 | HR 113 | Temp 98.7°F | Resp 16 | Ht 63.0 in | Wt 206.1 lb

## 2016-07-12 DIAGNOSIS — M359 Systemic involvement of connective tissue, unspecified: Secondary | ICD-10-CM

## 2016-07-12 DIAGNOSIS — E559 Vitamin D deficiency, unspecified: Secondary | ICD-10-CM

## 2016-07-12 DIAGNOSIS — E785 Hyperlipidemia, unspecified: Secondary | ICD-10-CM

## 2016-07-12 DIAGNOSIS — E8881 Metabolic syndrome: Secondary | ICD-10-CM | POA: Diagnosis not present

## 2016-07-12 DIAGNOSIS — Z79899 Other long term (current) drug therapy: Secondary | ICD-10-CM | POA: Diagnosis not present

## 2016-07-12 DIAGNOSIS — G9332 Myalgic encephalomyelitis/chronic fatigue syndrome: Secondary | ICD-10-CM

## 2016-07-12 DIAGNOSIS — F331 Major depressive disorder, recurrent, moderate: Secondary | ICD-10-CM

## 2016-07-12 DIAGNOSIS — R5382 Chronic fatigue, unspecified: Secondary | ICD-10-CM

## 2016-07-12 DIAGNOSIS — E790 Hyperuricemia without signs of inflammatory arthritis and tophaceous disease: Secondary | ICD-10-CM

## 2016-07-12 DIAGNOSIS — M797 Fibromyalgia: Secondary | ICD-10-CM

## 2016-07-12 DIAGNOSIS — D8989 Other specified disorders involving the immune mechanism, not elsewhere classified: Secondary | ICD-10-CM

## 2016-07-12 DIAGNOSIS — M3503 Sicca syndrome with myopathy: Secondary | ICD-10-CM

## 2016-07-12 NOTE — Progress Notes (Signed)
Name: Tracy [name]   MRN: [id number]    DOB: Sep 04, 1970   Date:07/12/2016       Progress Note  Subjective  Chief Complaint  Chief Complaint  Patient presents with  . degenerative disc disease  . Hyperlipidemia  . Depression  . Fibromyalgia    HPI   Major Depression: she is taking Duloxetine 120 mg daily, increased dose Fall 2017, she is doing better no crying spells, fatigue is stable and multifactorial, no longer as snappy. Used to take Prozac in the past and would like to go back on it but she has chronic fatigue syndrome and connective tissue disorder with joint pains, the neurologist started her on Cymbalta and Rheumatologist is managing medication. Explained that she would benefit from therapy and consider seeing psychiatrist, but she refuses to go at this time.   FMS/Connective Tissue Disorder: she is always in pain, she goes to pain clinic at [facility], she has pain all the time, pain level right now is 3/10.  Other fatigue: obesity, low potassium, also has striae, discussed Cushing's, seen by Endo and repeat work up was negative. No further evaluation required at this time  Metabolic Syndrome: she denies polyphagia, no  Polydipsia, but sips on water because of Sjogren's, she has polyuria from lasix. Not following a diabetic diet, and off Metformin. Last hgbA1C was at goal, and she wants to have it rechecked  Dyslipidemia: she is taking Pravastatin, dose increased on her last visit, no side effects - she has FMS, but does not think her muscle aches is from statin.  DDD with radiculitis: referred by Rheumatologist to neurologist because of paresthesia on fingers and toes, but after evaluation likely from radiculitis. She is taking higher dose of Cymbalta and is doing okay, still has some decrease in sensation on finger tips   Sjogrens: sees Rheumatologist, still has myalgias, dry mouth, dry eyes and daily fatigue  AR: symptoms are  stable.  Patient Active Problem List   Diagnosis Date Noted  . Elevated uric acid in blood 04/28/2016  . Degenerative disc disease, lumbar 05/26/2015  . Lumbar herniated disc 05/26/2015  . Connective tissue disorder ([place]) 12/09/2014  . 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 Fibromyalgia 12/09/2014  . Abnormal ECG 12/08/2014  . Abnormal antinuclear antibody titer 12/08/2014  . Major depression, chronic 12/08/2014  . Edema leg 12/08/2014  . 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 Dyslipidemia 12/08/2014  . Family history of breast cancer 12/08/2014  . H/O: hysterectomy 12/08/2014  . Dysmetabolic syndrome 78/29/5621  . Obesity (BMI 30-39.9) 12/08/2014  . Tobacco abuse 12/08/2014  . Disorder of tendon 12/08/2014  . Sjogren's syndrome ([place]) 12/08/2014  . Allergic rhinitis, seasonal 12/08/2014  . Snores 12/08/2014  . Tenosynovitis of thumb 12/02/2014    Past Surgical History:  Procedure Laterality Date  . ABDOMINAL HYSTERECTOMY    . BREAST REDUCTION SURGERY Bilateral 2000   [facility]  . CARPAL TUNNEL RELEASE Right 12/16/2015  . CARPAL TUNNEL RELEASE Left 04/18/2016   also left thumb replacement  . CHOLECYSTECTOMY    . ENDOMETRIAL ABLATION     ovarian cyst removal  . JOINT REPLACEMENT Right 12/16/2015   right thumb  . NASAL SINUS SURGERY    . TUBAL LIGATION      Family History  Problem Relation Age of Onset  . Hypertension Mother   . Allergic rhinitis Mother   . Hypertension Father   .  Alcohol abuse Father   . Heart disease Father   . Alcohol abuse Brother   . Hypertension Brother     Social History   Social History  . Marital status: Married    Spouse name: Glendell Docker  . Number of children: 1  . Years of education: N/A   Occupational History  . Not on file.   Social History Main Topics  . Smoking status: Current Every Day Smoker    Packs/day: 0.75     Years: 26.00    Types: Cigarettes    Start date: 12/08/1988  . Smokeless tobacco: Never Used  . Alcohol use No  . Drug use: No  . Sexual activity: Yes    Partners: Male    Birth control/ protection: Other-see comments     Comment: Hysterectomy   Other Topics Concern  . Not on file   Social History Narrative   Married, she has a grown daughter that has autism.    She lost her job at [employer] on 10/22/2015 ( worker there for 10 years), after she used all her FMLA for medical problems. She was fired the day after her short term disability ran out and she was getting ready to have carpal tunnel repair and right thumb replacement.      Current Outpatient Prescriptions:  .  amitriptyline (ELAVIL) 25 MG tablet, Take by mouth., Disp: , Rfl:  .  aspirin 81 MG tablet, Take by mouth., Disp: , Rfl:  .  betamethasone dipropionate (DIPROLENE) 0.05 % cream, BETAMETHASONE DIPROPIONATE, 0.05% (External Cream) - Historical Medication  apply twice a day as needed (0.05 %) Active Comments: Medication taken as needed. , Disp: , Rfl:  .  Biotin (BIOTIN 5000) 5 MG CAPS, Take by mouth., Disp: , Rfl:  .  Chlorzoxazone (LORZONE) 375 MG TABS, Take 375 mg by mouth 3 (three) times daily., Disp: , Rfl:  .  Cholecalciferol (VITAMIN D) 2000 UNITS tablet, Take by mouth., Disp: , Rfl:  .  cyclobenzaprine (FLEXERIL) 5 MG tablet, Take 1 tablet by mouth at bedtime., Disp: , Rfl: 0 .  cycloSPORINE (RESTASIS) 0.05 % ophthalmic emulsion, 1 drop 2 (two) times daily., Disp: , Rfl:  .  dextroamphetamine (DEXTROSTAT) 10 MG tablet, TK 1 T PO TID, Disp: , Rfl: 0 .  Dextroamphetamine Sulfate (DEXTROSTAT PO), Take 1 tablet by mouth 2 (two) times daily. Takes 2 tablets in the morning and 1 in the afternoon, Disp: , Rfl:  .  DULoxetine (CYMBALTA) 60 MG capsule, Take 2 capsules (120 mg total) by mouth daily., Disp: 180 capsule, Rfl: 1 .  estradiol (ESTRACE) 1 MG tablet, TAKE 1 TABLET DAILY, Disp: 90 tablet, Rfl: 0 .  fluconazole  (DIFLUCAN) 150 MG tablet, , Disp: , Rfl:  .  fluticasone (FLONASE ALLERGY RELIEF) 50 MCG/ACT nasal spray, Place 2 sprays into both nostrils daily., Disp: 48 g, Rfl: 1 .  furosemide (LASIX) 40 MG tablet, Take by mouth., Disp: , Rfl:  .  hydroxychloroquine (PLAQUENIL) 200 MG tablet, Take by mouth., Disp: , Rfl:  .  KLOR-CON M20 20 MEQ tablet, TAKE 1 TABLET TWICE A DAY, Disp: 180 tablet, Rfl: 0 .  loratadine (CLARITIN) 10 MG tablet, Take 1 tablet (10 mg total) by mouth daily., Disp: 90 tablet, Rfl: 0 .  metaxalone (SKELAXIN) 800 MG tablet, , Disp: , Rfl:  .  Multiple Vitamins-Minerals (MULTI VITAMIN/MINERALS) TABS, Take by mouth., Disp: , Rfl:  .  nabumetone (RELAFEN) 750 MG tablet, Take by mouth., Disp: , Rfl:  .  NARCAN 4 MG/0.1ML LIQD nasal spray kit, , Disp: , Rfl:  .  Oxycodone HCl 10 MG TABS, Take 1 tablet by mouth 5 (five) times daily., Disp: , Rfl:  .  pravastatin (PRAVACHOL) 40 MG tablet, TAKE 1 TABLET EVERY EVENING FOR CHOLESTEROL, Disp: 90 tablet, Rfl: 1 .  ranitidine (ZANTAC) 150 MG tablet, TAKE 1 TABLET TWICE A DAY, Disp: 180 tablet, Rfl: 1 .  ULORIC 40 MG tablet, , Disp: , Rfl:  .  Vitamin D, Ergocalciferol, (DRISDOL) 50000 units CAPS capsule, , Disp: , Rfl:   Allergies  Allergen Reactions  . Gabapentin Other (See Comments) and Swelling  . Meperidine   . Tramadol      ROS  Constitutional: Negative for fever, positive for mild weight change.  Respiratory: Negative for cough and shortness of breath.   Cardiovascular: Negative for chest pain or palpitations.  Gastrointestinal: Negative for abdominal pain, no bowel changes.  Musculoskeletal: Positive  for gait problem and  joint swelling.  Skin: Negative for rash.  Neurological: Negative for dizziness , positive for headache.  No other specific complaints in a complete review of systems (except as listed in HPI above).  Objective  Vitals:   07/12/16 1038  BP: 118/68  Pulse: (!) 113  Resp: 16  Temp: 98.7 F (37.1 C)   SpO2: 99%  Weight: 206 lb 2 oz (93.5 kg)  Height: '5\' 3"'  (1.6 m)    Body mass index is 36.51 kg/m.  Physical Exam  Constitutional: Patient appears well-developed Obese No distress.  HEENT: head atraumatic, normocephalic, pupils equal and reactive to light, has candy in her mouth ( she has sjogren's and mouth is usually dry)  neck supple, throat within normal limits Cardiovascular: Normal rate, regular rhythm and normal heart sounds.  No murmur heard. No BLE edema. Pulmonary/Chest: Effort normal and breath sounds normal. No respiratory distress. Abdominal: Soft.  There is no tenderness. Psychiatric: Patient has a normal mood and affect. behavior is normal. Judgment and thought content normal. Muscular Skeletal: she has positive trigger points, wearing a brace on left hand/thumb  PHQ2/9: Depression screen [facility] 2/9 07/12/2016 04/28/2016 01/26/2016 05/26/2015 12/09/2014  Decreased Interest '1 1 2 ' 0 0  Down, Depressed, Hopeless 0 0 2 0 0  PHQ - 2 Score '1 1 4 ' 0 0  Altered sleeping '3 3 2 ' - -  Tired, decreased energy '3 3 2 ' - -  Change in appetite 0 0 0 - -  Feeling bad or failure about yourself  0 0 0 - -  Trouble concentrating 1 0 0 - -  Moving slowly or fidgety/restless 0 0 0 - -  Suicidal thoughts 0 0 0 - -  PHQ-9 Score '8 7 8 ' - -     Fall Risk: Fall Risk  04/28/2016 01/26/2016 05/26/2015 12/09/2014  Falls in the past year? Yes Yes No Yes  Number falls in past yr: 2 or more 2 or more - 2 or more  Injury with Fall? No Yes - Yes  Risk Factor Category  - High Fall Risk - -  Follow up - Falls evaluation completed - -     Assessment & Plan  1. Dysmetabolic syndrome  Discussed life style modification  - Insulin, 2 Hour - Hemoglobin A1c  2. Dyslipidemia  - Lipid panel  3. Moderate episode of recurrent major depressive disorder ([place])  Explained that she needs to see psychiatrist and therapist, but she states she is feeling better  4. Connective tissue disorder ([place])  Continue  follow up with Rheumatologist  5. 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 Fibromyalgia  stable  7. Elevated uric acid in blood  - Uric acid  8. CFIDS (chronic fatigue and immune dysfunction syndrome) ([place])  Sees Rheumatologist  9. Long-term use of high-risk medication  - Comp. Metabolic Panel (12) - CBC With Differential - Vitamin B12  10. Vitamin D deficiency  - VITAMIN D 25 Hydroxy (Vit-D Deficiency, Fractures)

## 2016-09-14 ENCOUNTER — Other Ambulatory Visit: Payer: Self-pay | Admitting: Family Medicine

## 2016-09-14 NOTE — Telephone Encounter (Signed)
Pt would like a refill on her allergy medication to be sent to [facility].

## 2016-09-15 MED ORDER — LORATADINE 10 MG PO TABS: 10.0000 mg | ORAL_TABLET | Freq: Every day | ORAL | 0 refills | Status: DC

## 2016-09-15 MED ORDER — FLUTICASONE PROPIONATE 50 MCG/ACT NA SUSP: 2.0000 | Freq: Every day | NASAL | 1 refills | Status: DC

## 2016-09-27 ENCOUNTER — Other Ambulatory Visit: Payer: Self-pay | Admitting: Family Medicine

## 2016-09-27 DIAGNOSIS — E785 Hyperlipidemia, unspecified: Secondary | ICD-10-CM

## 2016-10-07 ENCOUNTER — Other Ambulatory Visit: Payer: Self-pay | Admitting: Family Medicine

## 2016-10-07 DIAGNOSIS — F331 Major depressive disorder, recurrent, moderate: Secondary | ICD-10-CM

## 2016-10-30 ENCOUNTER — Other Ambulatory Visit: Payer: Self-pay | Admitting: Family Medicine

## 2016-10-30 DIAGNOSIS — Z1231 Encounter for screening mammogram for malignant neoplasm of breast: Secondary | ICD-10-CM

## 2016-10-31 ENCOUNTER — Ambulatory Visit
Admission: RE | Admit: 2016-10-31 | Discharge: 2016-10-31 | Disposition: A | Discharge status: Home / Self Care | Source: Ambulatory Visit | Attending: Family Medicine | Admitting: Family Medicine

## 2016-10-31 DIAGNOSIS — Z1231 Encounter for screening mammogram for malignant neoplasm of breast: Secondary | ICD-10-CM | POA: Diagnosis not present

## 2016-11-06 ENCOUNTER — Encounter: Payer: Self-pay | Admitting: Family Medicine

## 2016-11-15 ENCOUNTER — Other Ambulatory Visit: Payer: Self-pay | Admitting: Family Medicine

## 2016-11-15 DIAGNOSIS — E8940 Asymptomatic postprocedural ovarian failure: Secondary | ICD-10-CM

## 2016-11-15 MED ORDER — ESTRADIOL 1 MG PO TABS: 1.0000 mg | ORAL_TABLET | Freq: Every day | ORAL | 0 refills | Status: DC

## 2016-11-21 ENCOUNTER — Other Ambulatory Visit: Payer: Self-pay | Admitting: Family Medicine

## 2016-11-21 NOTE — Telephone Encounter (Signed)
Patient requesting refill of Loratadine to [facility].

## 2016-12-09 ENCOUNTER — Other Ambulatory Visit: Payer: Self-pay | Admitting: Family Medicine

## 2017-01-21 ENCOUNTER — Other Ambulatory Visit: Payer: Self-pay | Admitting: Family Medicine

## 2017-01-21 DIAGNOSIS — E8940 Asymptomatic postprocedural ovarian failure: Secondary | ICD-10-CM

## 2017-01-26 ENCOUNTER — Encounter: Payer: Self-pay | Admitting: Family Medicine

## 2017-01-26 ENCOUNTER — Ambulatory Visit (INDEPENDENT_AMBULATORY_CARE_PROVIDER_SITE_OTHER): Admitting: Family Medicine

## 2017-01-26 VITALS — BP 120/80 | HR 104 | Temp 98.6°F | Resp 16 | Ht 63.0 in | Wt 218.2 lb

## 2017-01-26 DIAGNOSIS — E785 Hyperlipidemia, unspecified: Secondary | ICD-10-CM

## 2017-01-26 DIAGNOSIS — Z23 Encounter for immunization: Secondary | ICD-10-CM

## 2017-01-26 DIAGNOSIS — E8940 Asymptomatic postprocedural ovarian failure: Secondary | ICD-10-CM

## 2017-01-26 DIAGNOSIS — Z01419 Encounter for gynecological examination (general) (routine) without abnormal findings: Secondary | ICD-10-CM | POA: Diagnosis not present

## 2017-01-26 DIAGNOSIS — G9332 Myalgic encephalomyelitis/chronic fatigue syndrome: Secondary | ICD-10-CM

## 2017-01-26 DIAGNOSIS — E8881 Metabolic syndrome: Secondary | ICD-10-CM

## 2017-01-26 DIAGNOSIS — R5382 Chronic fatigue, unspecified: Secondary | ICD-10-CM

## 2017-01-26 DIAGNOSIS — E669 Obesity, unspecified: Secondary | ICD-10-CM

## 2017-01-26 DIAGNOSIS — K219 Gastro-esophageal reflux disease without esophagitis: Secondary | ICD-10-CM

## 2017-01-26 DIAGNOSIS — D8989 Other specified disorders involving the immune mechanism, not elsewhere classified: Secondary | ICD-10-CM

## 2017-01-26 MED ORDER — PRAVASTATIN SODIUM 40 MG PO TABS: ORAL_TABLET | ORAL | 1 refills | Status: DC

## 2017-01-26 MED ORDER — ESTRADIOL 0.5 MG PO TABS: 1.0000 mg | ORAL_TABLET | Freq: Every day | ORAL | 3 refills | Status: DC

## 2017-01-26 MED ORDER — RANITIDINE HCL 150 MG PO TABS: 150.0000 mg | ORAL_TABLET | Freq: Two times a day (BID) | ORAL | 1 refills | Status: DC

## 2017-01-26 NOTE — Progress Notes (Signed)
Name: Tracy [name]   MRN: [id number]    DOB: May 24, 1970   Date:01/26/2017       Progress Note  Subjective  Chief Complaint  Chief Complaint  Patient presents with  . Annual Exam    HPI  Well Woman: she had hysterectomy for benign causes, no longer needs pap smear. Mammogram reviewed and normal. She denies vaginal discharge, pain during intercourse or breast lumps  FMS/Connective Tissue Disorder: she is always in pain, she goes to pain clinic at [place], she has pain all the time, she also has recurrent cervical lymphadenopathy and periods of sore throat, explained that is common for patients with CFS  Metabolic Syndrome: she denies polyphagia, no  Polydipsia, but sips on water because of Sjogren's, she has polyuria from lasix. Not following a diabetic diet, and off Metformin. We will recheck hgbA1C  Dyslipidemia: she is taking Pravastatin, dose increased on her last visit, no side effects - she has FMS, but does not think her muscle aches is from statin. She needs to have labs done  Sjogrens: sees Rheumatologist, still has myalgias, dry mouth, dry eyes and daily fatigue Patient Active Problem List   Diagnosis Date Noted  . Elevated uric acid in blood 04/28/2016  . Degenerative disc disease, lumbar 05/26/2015  . Lumbar herniated disc 05/26/2015  . Connective tissue disorder ([place]) 12/09/2014  . 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 Fibromyalgia 12/09/2014  . Abnormal ECG 12/08/2014  . Abnormal antinuclear antibody titer 12/08/2014  . Major depression, chronic 12/08/2014  . Edema leg 12/08/2014  . 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 CFIDS (chronic fatigue and immune dysfunction syndrome) ([place]) 12/08/2014  . 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 Dyslipidemia 12/08/2014  . Family history of breast cancer 12/08/2014  . H/O: hysterectomy 12/08/2014  . Dysmetabolic syndrome 44/31/5400  . Obesity (BMI 30-39.9) 12/08/2014  . Tobacco abuse 12/08/2014  .  Disorder of tendon 12/08/2014  . Sjogren's syndrome ([place]) 12/08/2014  . Allergic rhinitis, seasonal 12/08/2014  . Snores 12/08/2014  . Tenosynovitis of thumb 12/02/2014    Past Surgical History:  Procedure Laterality Date  . ABDOMINAL HYSTERECTOMY    . BREAST REDUCTION SURGERY Bilateral 2000   [facility]  . CARPAL TUNNEL RELEASE Right 12/16/2015  . CARPAL TUNNEL RELEASE Left 04/18/2016   also left thumb replacement  . CHOLECYSTECTOMY    . ENDOMETRIAL ABLATION     ovarian cyst removal  . JOINT REPLACEMENT Right 12/16/2015   right thumb  . NASAL SINUS SURGERY    . REDUCTION MAMMAPLASTY  2000   september  . TUBAL LIGATION      Family History  Problem Relation Age of Onset  . Hypertension Mother   . Allergic rhinitis Mother   . Hypertension Father   . Alcohol abuse Father   . Heart disease Father   . Alcohol abuse Brother   . Hypertension Brother   . Breast cancer Maternal Aunt 30       two aunts  . Breast cancer Paternal Aunt   . Breast cancer Maternal Grandmother   . Breast cancer Cousin 57       paternal cousin    Social History   Socioeconomic History  . Marital status: Married    Spouse name: Glendell Docker  . Number of children: 1  . Years of education: Not on file  . Highest education level: Some college, no degree  Social Needs  . Financial resource strain: Not hard at all  . Food insecurity - worry:  Never true  . Food insecurity - inability: Never true  . Transportation needs - medical: No  . Transportation needs - non-medical: No  Occupational History  . Occupation: disabled    Comment: used to work at [employer]  . Smoking status: Former Smoker    Packs/day: 0.75    Years: 28.00    Pack years: 21.00    Types: Cigarettes    Start date: 12/08/1988    Last attempt to quit: 12/11/2016    Years since quitting: 0.1  . Smokeless tobacco: Never Used  Substance and Sexual Activity  . Alcohol use: No    Alcohol/week: 0.0 oz  . Drug use: No  . Sexual  activity: Yes    Partners: Male    Birth control/protection: Other-see comments    Comment: Hysterectomy  Other Topics Concern  . Not on file  Social History Narrative   Married, she has a grown daughter that has autism.    She lost her job at [employer] on 10/22/2015 ( worker there for 10 years), after she used all her FMLA for medical problems. She was fired the day after her short term disability ran out and she was getting ready to have carpal tunnel repair and right thumb replacement.      Current Outpatient Medications:  .  amitriptyline (ELAVIL) 25 MG tablet, Take by mouth., Disp: , Rfl:  .  aspirin 81 MG tablet, Take by mouth., Disp: , Rfl:  .  betamethasone dipropionate (DIPROLENE) 0.05 % cream, BETAMETHASONE DIPROPIONATE, 0.05% (External Cream) - Historical Medication  apply twice a day as needed (0.05 %) Active Comments: Medication taken as needed. , Disp: , Rfl:  .  Biotin (BIOTIN 5000) 5 MG CAPS, Take by mouth., Disp: , Rfl:  .  Cholecalciferol (VITAMIN D) 2000 UNITS tablet, Take by mouth., Disp: , Rfl:  .  cyclobenzaprine (FLEXERIL) 5 MG tablet, Take 1 tablet by mouth at bedtime., Disp: , Rfl: 0 .  cycloSPORINE (RESTASIS) 0.05 % ophthalmic emulsion, 1 drop 2 (two) times daily., Disp: , Rfl:  .  dextroamphetamine (DEXTROSTAT) 10 MG tablet, TK 1 T PO TID, Disp: , Rfl: 0 .  Dextroamphetamine Sulfate (DEXTROSTAT PO), Take 1 tablet by mouth 2 (two) times daily. Takes 2 tablets in the morning and 1 in the afternoon, Disp: , Rfl:  .  DULoxetine (CYMBALTA) 60 MG capsule, TAKE 2 CAPSULES DAILY, Disp: 180 capsule, Rfl: 1 .  estradiol (ESTRACE) 0.5 MG tablet, Take 2 tablets (1 mg total) daily by mouth., Disp: 90 tablet, Rfl: 3 .  fluticasone (FLONASE ALLERGY RELIEF) 50 MCG/ACT nasal spray, Place 2 sprays into both nostrils daily., Disp: 48 g, Rfl: 1 .  furosemide (LASIX) 40 MG tablet, Take by mouth., Disp: , Rfl:  .  hydroxychloroquine (PLAQUENIL) 200 MG tablet, Take by mouth., Disp: , Rfl:   .  KLOR-CON M20 20 MEQ tablet, TAKE 1 TABLET TWICE A DAY, Disp: 180 tablet, Rfl: 0 .  metaxalone (SKELAXIN) 800 MG tablet, , Disp: , Rfl:  .  Multiple Vitamins-Minerals (MULTI VITAMIN/MINERALS) TABS, Take by mouth., Disp: , Rfl:  .  nabumetone (RELAFEN) 750 MG tablet, Take by mouth., Disp: , Rfl:  .  NARCAN 4 MG/0.1ML LIQD nasal spray kit, , Disp: , Rfl:  .  Oxycodone HCl 10 MG TABS, Take 1 tablet by mouth 5 (five) times daily., Disp: , Rfl:  .  pravastatin (PRAVACHOL) 40 MG tablet, TAKE 1 TABLET EVERY EVENING FOR CHOLESTEROL, Disp: 90 tablet, Rfl: 1 .  ranitidine (ZANTAC) 150 MG tablet, Take 1 tablet (150 mg total) 2 (two) times daily by mouth., Disp: 180 tablet, Rfl: 1 .  ULORIC 40 MG tablet, , Disp: , Rfl:  .  Vitamin D, Ergocalciferol, (DRISDOL) 50000 units CAPS capsule, , Disp: , Rfl:  .  Chlorzoxazone (LORZONE) 375 MG TABS, Take 375 mg by mouth 3 (three) times daily., Disp: , Rfl:  .  loratadine (CLARITIN) 10 MG tablet, TAKE 1 TABLET DAILY (Patient not taking: Reported on 01/26/2017), Disp: 90 tablet, Rfl: 0  Allergies  Allergen Reactions  . Tomato Hives  . Gabapentin Other (See Comments) and Swelling  . Meperidine   . Tramadol      ROS  Constitutional: Negative for fever , positive for weight change.  Respiratory: Positive  for cough and shortness of breath with activity .   Cardiovascular: Negative for chest pain or palpitations.  Gastrointestinal: Negative for abdominal pain, no bowel changes.  Musculoskeletal: positive  for gait problem , positive for  joint swelling.  Skin: Negative for rash.  Neurological: Negative for dizziness or headache.  No other specific complaints in a complete review of systems (except as listed in HPI above).  Objective  Vitals:   01/26/17 1125  BP: 120/80  Pulse: (!) 104  Resp: 16  Temp: 98.6 F (37 C)  TempSrc: Oral  SpO2: 98%  Weight: 218 lb 3.2 oz (99 kg)  Height: _0  (1.6 m)    Body mass index is 38.65 kg/m.  Physical  Exam  Constitutional: Patient appears well-developed and obese  No distress.  HENT: Head: Normocephalic and atraumatic. Ears: B TMs ok, no erythema or effusion; Nose: Nose normal. Mouth/Throat: Oropharynx is clear and moist. No oropharyngeal exudate.  Eyes: Conjunctivae and EOM are normal. Pupils are equal, round, and reactive to light. No scleral icterus.  Neck: Normal range of motion. Neck supple. No JVD present. No thyromegaly present.  Cardiovascular: Normal rate, regular rhythm and normal heart sounds.  No murmur heard. No BLE edema. Pulmonary/Chest: Effort normal and breath sounds normal. No respiratory distress. Abdominal: Soft. Bowel sounds are normal, no distension. There is no tenderness. no masses Breast: no lumps or masses, no nipple discharge or rashes FEMALE GENITALIA:  External genitalia normal Bimanual exam normal without masses RECTAL: not done Musculoskeletal: Normal range of motion, no joint effusions. No gross deformities Neurological: he is alert and oriented to person, place, and time. No cranial nerve deficit. Coordination, balance, strength, speech and gait are normal.  Skin: Skin is warm and dry. No rash noted. No erythema.  Psychiatric: Patient has a normal mood and affect. behavior is normal. Judgment and thought content normal.  PHQ2/9: Depression screen [facility] 2/9 01/26/2017 07/12/2016 04/28/2016 01/26/2016 05/26/2015  Decreased Interest 0 _1 0  Down, Depressed, Hopeless 0 0 0 2 0  PHQ - 2 Score 0 _2 0  Altered sleeping - _3 -  Tired, decreased energy - _4 -  Change in appetite - 0 0 0 -  Feeling bad or failure about yourself  - 0 0 0 -  Trouble concentrating - 1 0 0 -  Moving slowly or fidgety/restless - 0 0 0 -  Suicidal thoughts - 0 0 0 -  PHQ-9 Score - _5 -     Fall Risk: Fall Risk  01/26/2017 04/28/2016 01/26/2016 05/26/2015 12/09/2014  Falls in the past year? No Yes Yes No Yes  Number falls in past yr: - 2  or more 2 or more - 2 or more   Injury with Fall? - No Yes - Yes  Risk Factor Category  - - High Fall Risk - -  Follow up - - Falls evaluation completed - -    Functional Status Survey: Is the patient deaf or have difficulty hearing?: No Does the patient have difficulty seeing, even when wearing glasses/contacts?: No Does the patient have difficulty concentrating, remembering, or making decisions?: No Does the patient have difficulty walking or climbing stairs?: No Does the patient have difficulty dressing or bathing?: No Does the patient have difficulty doing errands alone such as visiting a doctor's office or shopping?: No      Assessment & Plan  1. Well woman exam  Discussed importance of 150 minutes of physical activity weekly, eat two servings of fish weekly, eat one serving of tree nuts ( cashews, pistachios, pecans, almonds.Marland Kitchen) every other day, eat 6 servings of fruit/vegetables daily and drink plenty of water and avoid sweet beverages.   - Vitamin B12 - Folate - TSH  2. Need for immunization against influenz  - Flu Vaccine QUAD 6+ mos PF IM (Fluarix Quad PF)  3. Dyslipidemia  - Lipid panel - pravastatin (PRAVACHOL) 40 MG tablet; TAKE 1 TABLET EVERY EVENING FOR CHOLESTEROL  Dispense: 90 tablet; Refill: 1  4. Dysmetabolic syndrome  - Hemoglobin A1c - Insulin, random  5. CFIDS (chronic fatigue and immune dysfunction syndrome) ([facility])  Explained that lymphadenopathy and low grade fever are common in Chronic fatigue syndrome  6. Surgical menopause  - estradiol (ESTRACE) 0.5 MG tablet; Take 2 tablets (1 mg total) daily by mouth.  Dispense: 90 tablet; Refill: 3  7. GERD without esophagitis  - ranitidine (ZANTAC) 150 MG tablet; Take 1 tablet (150 mg total) 2 (two) times daily by mouth.  Dispense: 180 tablet; Refill: 1  8. Obesity (BMI 30.0-34.9)

## 2017-01-31 LAB — LIPID PANEL
CHOLESTEROL TOTAL: 176 mg/dL (ref 100–199)
Chol/HDL Ratio: 2.6 ratio (ref 0.0–4.4)
HDL: 69 mg/dL (ref >39)
LDL Calculated: 84 mg/dL (ref 0–99)
Triglycerides: 116 mg/dL (ref 0–149)
VLDL CHOLESTEROL CAL: 23 mg/dL (ref 5–40)

## 2017-01-31 LAB — VITAMIN B12: Vitamin B-12: 262 pg/mL (ref 232–1245)

## 2017-01-31 LAB — FOLATE: Folate: 15.2 ng/mL (ref >3.0)

## 2017-01-31 LAB — TSH: TSH: 2.61 u[IU]/mL (ref 0.450–4.500)

## 2017-01-31 LAB — INSULIN, RANDOM: INSULIN: 14.3 u[IU]/mL (ref 2.6–24.9)

## 2017-01-31 LAB — HEMOGLOBIN A1C
ESTIMATED AVERAGE GLUCOSE: 114 mg/dL
HEMOGLOBIN A1C: 5.6 % (ref 4.8–5.6)

## 2017-02-05 ENCOUNTER — Encounter: Payer: Self-pay | Admitting: Family Medicine

## 2017-02-06 ENCOUNTER — Other Ambulatory Visit: Payer: Self-pay | Admitting: Family Medicine

## 2017-02-06 MED ORDER — SEMAGLUTIDE(0.25 OR 0.5MG/DOS) 2 MG/1.5ML ~~LOC~~ SOPN: 0.5000 mg | PEN_INJECTOR | SUBCUTANEOUS | 2 refills | Status: DC

## 2017-02-06 NOTE — Progress Notes (Signed)
Sent rx for Ozempic for pre-diabetes, patient will also come in for samples

## 2017-02-12 ENCOUNTER — Ambulatory Visit (INDEPENDENT_AMBULATORY_CARE_PROVIDER_SITE_OTHER)

## 2017-02-12 DIAGNOSIS — E538 Deficiency of other specified B group vitamins: Secondary | ICD-10-CM

## 2017-02-12 MED ORDER — CYANOCOBALAMIN 1000 MCG/ML IJ SOLN
1000.0000 ug | Freq: Once | INTRAMUSCULAR | Status: AC
  Administered 2017-02-12: 1000 ug via INTRAMUSCULAR

## 2017-03-10 ENCOUNTER — Other Ambulatory Visit: Payer: Self-pay | Admitting: Family Medicine

## 2017-03-12 NOTE — Telephone Encounter (Signed)
Refill request for general medication: Klor-Con 20 MEQ  Last office visit: 01/26/2017  Last physical exam: 01/26/2017  Follow up visit: 03/26/2017

## 2017-03-15 ENCOUNTER — Ambulatory Visit

## 2017-03-26 ENCOUNTER — Ambulatory Visit: Admitting: Family Medicine

## 2017-04-04 ENCOUNTER — Encounter: Payer: Self-pay | Admitting: Family Medicine

## 2017-04-04 ENCOUNTER — Other Ambulatory Visit: Payer: Self-pay | Admitting: Family Medicine

## 2017-04-04 ENCOUNTER — Ambulatory Visit (INDEPENDENT_AMBULATORY_CARE_PROVIDER_SITE_OTHER): Admitting: Family Medicine

## 2017-04-04 VITALS — BP 130/84 | HR 100 | Temp 98.2°F | Resp 16 | Ht 63.0 in | Wt 225.9 lb

## 2017-04-04 DIAGNOSIS — K1379 Other lesions of oral mucosa: Secondary | ICD-10-CM

## 2017-04-04 DIAGNOSIS — R238 Other skin changes: Secondary | ICD-10-CM | POA: Diagnosis not present

## 2017-04-04 DIAGNOSIS — R600 Localized edema: Secondary | ICD-10-CM

## 2017-04-04 DIAGNOSIS — M3503 Sicca syndrome with myopathy: Secondary | ICD-10-CM

## 2017-04-04 DIAGNOSIS — J029 Acute pharyngitis, unspecified: Secondary | ICD-10-CM

## 2017-04-04 DIAGNOSIS — M797 Fibromyalgia: Secondary | ICD-10-CM | POA: Diagnosis not present

## 2017-04-04 DIAGNOSIS — M1380 Other specified arthritis, unspecified site: Secondary | ICD-10-CM

## 2017-04-04 DIAGNOSIS — M064 Inflammatory polyarthropathy: Secondary | ICD-10-CM | POA: Diagnosis not present

## 2017-04-04 DIAGNOSIS — R233 Spontaneous ecchymoses: Secondary | ICD-10-CM

## 2017-04-04 MED ORDER — FIRST-DUKES MOUTHWASH MT SUSP: 5.0000 mL | Freq: Three times a day (TID) | OROMUCOSAL | 0 refills | Status: DC | PRN

## 2017-04-04 NOTE — Progress Notes (Signed)
Name: Tracy [name]   MRN: [id number]    DOB: 1970/10/31   Date:04/04/2017       Progress Note  Subjective  Chief Complaint  Chief Complaint  Patient presents with  . Medication Problem  . Edema    legs swelling  . Bleeding/Bruising    all over legs    HPI  Pt presents with concern for medication issues and multiple concerns:  Mouth sores: She sees Dr. 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  She has been taking Methotrexate injections for 2 months now.  Mouth Sores: She notes red bumps on her hard palate of the mouth and pharynx that began yesterday - 6 areas of concern at first, progressively worsened into today; areas are described as mildly painful.  She also notes that the glands on the side of her mouth were swollen yesterday as well.  Easy Bruising: She has noted easy bruising to Bilateral thighs, sometimes on her arms and lower legs for about 4-5 months. Bruises are not painful. Denies any physical abuse, no recent injuries; she reports living a mostly sedentary lifestyle.  Questions if this is a medication issue; does take aspirin daily.  BLE Edema:  She was having BLE pitting edema - worse on the left today, and has been taking 6m lasix BID along with potassium twice daily.  Last visit with vascular was with Dr. WRonita Hippson 01/24/2016, was told unclear etiology with negative UKoreatesting; was recommended compression hose management, but does not wear them frequent.  She reports feeling bloated.  She reports some baseline costochondritis with shortness of breath - states no change from her usual baseline.  She is mostly sedentary.   Patient Active Problem List   Diagnosis Date Noted  . Elevated uric acid in blood 04/28/2016  . Degenerative disc disease, lumbar 05/26/2015  . Lumbar herniated disc 05/26/2015  . Connective tissue disorder  ([place] 12/09/2014  . 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 Fibromyalgia 12/09/2014  . Abnormal ECG 12/08/2014  . Abnormal antinuclear antibody titer 12/08/2014  . Major depression, chronic 12/08/2014  . Edema leg 12/08/2014  . 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 CFIDS (chronic fatigue and immune dysfunction syndrome) ([place] 12/08/2014  . 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 Dyslipidemia 12/08/2014  . Family history of breast cancer 12/08/2014  . H/O: hysterectomy 12/08/2014  . Dysmetabolic syndrome 009/98/3382 . Obesity (BMI 30-39.9) 12/08/2014  . Tobacco abuse 12/08/2014  . Disorder of tendon 12/08/2014  . Sjogren's syndrome ([place] 12/08/2014  . Allergic rhinitis, seasonal 12/08/2014  . Snores 12/08/2014  . Tenosynovitis of thumb 12/02/2014    Social History   Tobacco Use  . Smoking status: Former Smoker    Packs/day: 0.75    Years: 28.00    Pack years: 21.00    Types: Cigarettes    Start date: 12/08/1988    Last attempt to quit: 12/11/2016    Years since quitting: 0.3  . Smokeless tobacco: Never Used  Substance Use Topics  . Alcohol use: No    Alcohol/week: 0.0 oz     Current Outpatient Medications:  .  amitriptyline (ELAVIL) 25 MG tablet, Take by mouth., Disp: , Rfl:  .  aspirin 81 MG tablet, Take by mouth., Disp: , Rfl:  .  betamethasone dipropionate (DIPROLENE) 0.05 % cream, BETAMETHASONE DIPROPIONATE, 0.05% (External Cream) - Historical Medication  apply twice a day as needed (0.05 %)  Active Comments: Medication taken as needed. , Disp: , Rfl:  .  Biotin (BIOTIN 5000) 5 MG CAPS, Take by mouth., Disp: , Rfl:  .  Chlorzoxazone (LORZONE) 375 MG TABS, Take 375 mg by mouth 3 (three) times daily., Disp: , Rfl:  .  Cholecalciferol (VITAMIN D) 2000 UNITS tablet, Take by mouth., Disp: , Rfl:  .  cyclobenzaprine (FLEXERIL) 5 MG tablet, Take 1 tablet by mouth at bedtime., Disp: , Rfl: 0 .  cycloSPORINE (RESTASIS) 0.05 % ophthalmic emulsion, 1 drop 2 (two) times daily., Disp: , Rfl:   .  dextroamphetamine (DEXTROSTAT) 10 MG tablet, TK 1 T PO TID, Disp: , Rfl: 0 .  Dextroamphetamine Sulfate (DEXTROSTAT PO), Take 1 tablet by mouth 2 (two) times daily. Takes 2 tablets in the morning and 1 in the afternoon, Disp: , Rfl:  .  DULoxetine (CYMBALTA) 60 MG capsule, TAKE 2 CAPSULES DAILY, Disp: 180 capsule, Rfl: 1 .  estradiol (ESTRACE) 0.5 MG tablet, Take 2 tablets (1 mg total) daily by mouth., Disp: 90 tablet, Rfl: 3 .  fluticasone (FLONASE ALLERGY RELIEF) 50 MCG/ACT nasal spray, Place 2 sprays into both nostrils daily., Disp: 48 g, Rfl: 1 .  furosemide (LASIX) 40 MG tablet, Take by mouth., Disp: , Rfl:  .  hydroxychloroquine (PLAQUENIL) 200 MG tablet, Take by mouth., Disp: , Rfl:  .  loratadine (CLARITIN) 10 MG tablet, TAKE 1 TABLET DAILY, Disp: 90 tablet, Rfl: 0 .  metaxalone (SKELAXIN) 800 MG tablet, , Disp: , Rfl:  .  Multiple Vitamins-Minerals (MULTI VITAMIN/MINERALS) TABS, Take by mouth., Disp: , Rfl:  .  nabumetone (RELAFEN) 750 MG tablet, Take by mouth., Disp: , Rfl:  .  NARCAN 4 MG/0.1ML LIQD nasal spray kit, , Disp: , Rfl:  .  Oxycodone HCl 10 MG TABS, Take 1 tablet by mouth 5 (five) times daily., Disp: , Rfl:  .  potassium chloride SA (KLOR-CON M20) 20 MEQ tablet, Take 1 tablet (20 mEq total) by mouth 2 (two) times daily., Disp: 180 tablet, Rfl: 0 .  pravastatin (PRAVACHOL) 40 MG tablet, TAKE 1 TABLET EVERY EVENING FOR CHOLESTEROL, Disp: 90 tablet, Rfl: 1 .  ranitidine (ZANTAC) 150 MG tablet, Take 1 tablet (150 mg total) 2 (two) times daily by mouth., Disp: 180 tablet, Rfl: 1 .  ULORIC 40 MG tablet, , Disp: , Rfl:  .  Vitamin D, Ergocalciferol, (DRISDOL) 50000 units CAPS capsule, , Disp: , Rfl:  .  Semaglutide (OZEMPIC) 0.25 or 0.5 MG/DOSE SOPN, Inject 0.5 mg into the skin once a week. (Patient not taking: Reported on 04/04/2017), Disp: 2 pen, Rfl: 2  Allergies  Allergen Reactions  . Tomato Hives  . Gabapentin Other (See Comments) and Swelling  . Meperidine   .  Tramadol     ROS  Ten systems reviewed and is negative except as mentioned in HPI  Objective  Vitals:   04/04/17 1351  BP: 130/84  Pulse: 100  Resp: 16  Temp: 98.2 F (36.8 C)  TempSrc: Oral  SpO2: 93%  Weight: 225 lb 14.4 oz (102.5 kg)  Height: _0  (1.6 m)   Body mass index is 40.02 kg/m.  Nursing Note and Vital Signs reviewed.  Physical Exam  Constitutional: Patient appears well-developed and well-nourished. Obese No distress.  HEENT: head atraumatic, normocephalic, pupils equal and reactive to light, EOM's intact, TM's without erythema or bulging, no maxillary or frontal sinus tenderness, neck supple without lymphadenopathy, oropharynx and hard palate are pink with pencil-eraser sized erythematous patches that are  not raised, but do not appear to be petechial; no exudate. Cardiovascular: Normal rate, regular rhythm, S1/S2 present.  No murmur or rub heard. +2 pitting edema to BLE Pulmonary/Chest: Effort normal and breath sounds clear. No respiratory distress or retractions. Psychiatric: Patient has a normal mood and affect. behavior is normal. Judgment and thought content normal. Skin: Medial right thigh and lateral right shin has small round (approximately dime to nickel sized) ecchymotic areas noted that are non-tender.  No results found for this or any previous visit (from the past 72 hour(s)).  Assessment & Plan  1. Easy bruising - Continue Aspirin as pt is high CV risk. - Epstein-Barr virus VCA antibody panel - INR/PT  2. Sore throat - CBC w/Diff/Platelet - Diphenhyd-Hydrocort-Nystatin (FIRST-DUKES MOUTHWASH) SUSP; Use as directed 5 mLs in the mouth or throat 3 (three) times daily as needed.  Dispense: 237 mL; Refill: 0  3. Edema leg - B Nat Peptide - Compression stockings daily, increase walking and improve sedentary lifestyle.  4. Lesion of hard palate - CBC w/Diff/Platelet - Epstein-Barr virus VCA antibody panel - INR/PT -  Diphenhyd-Hydrocort-Nystatin (FIRST-DUKES MOUTHWASH) SUSP; Use as directed 5 mLs in the mouth or throat 3 (three) times daily as needed.  Dispense: 237 mL; Refill: 0  5. 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) - COMPLETE METABOLIC PANEL WITH GFR - CBC w/Diff/Platelet  6. Fibromyalgia - COMPLETE METABOLIC PANEL WITH GFR - CBC w/Diff/Platelet  7. Undifferentiated inflammatory arthritis (HCC) - COMPLETE METABOLIC PANEL WITH GFR - CBC w/Diff/Platelet  - Advised to continue aspirin, restart wearing compression socks, increase movement throughout the day, and contact rheumatologist if concerned for SE's of methotrexate.  -Red flags and when to present for emergency care or RTC including fever >101.55F, chest pain, shortness of breath, new/worsening/un-resolving symptoms, signs of bleeding such as gums bleeding, nose bleed, blood in stool, or dark/tarry stools, reviewed with patient at time of visit. Follow up and care instructions discussed and provided in AVS.

## 2017-04-05 ENCOUNTER — Telehealth: Payer: Self-pay | Admitting: Family Medicine

## 2017-04-05 ENCOUNTER — Other Ambulatory Visit: Payer: Self-pay | Admitting: Family Medicine

## 2017-04-05 DIAGNOSIS — F331 Major depressive disorder, recurrent, moderate: Secondary | ICD-10-CM

## 2017-04-05 DIAGNOSIS — J029 Acute pharyngitis, unspecified: Secondary | ICD-10-CM

## 2017-04-05 NOTE — Telephone Encounter (Signed)
Refill request for general medication: Duloxetine to [facility].   Last office visit: 01/26/2017  Follow up:  04/10/2017

## 2017-04-05 NOTE — Telephone Encounter (Signed)
Copied from CRM [phone]. Topic: Quick Communication - See Telephone Encounter >> Apr 05, 2017 10:48 AM Trula SladeWalter, Linda F wrote: CRM for notification. See Telephone encounter for:  04/05/17. Val w/[facility] [phone] want to inform the PCP that the patient's insurance does not cover Duke First Mouthwash, and if the PCP wants [employer] Magic Mouthwash, they don't have that one.  Please advise.

## 2017-04-06 LAB — CBC WITH DIFFERENTIAL/PLATELET
BASOS ABS: 0 10*3/uL (ref 0.0–0.2)
Basos: 0 %
EOS (ABSOLUTE): 0.1 10*3/uL (ref 0.0–0.4)
Eos: 1 %
Hematocrit: 39.2 % (ref 34.0–46.6)
Hemoglobin: 13.4 g/dL (ref 11.1–15.9)
IMMATURE GRANULOCYTES: 1 %
Immature Grans (Abs): 0.1 10*3/uL (ref 0.0–0.1)
Lymphocytes Absolute: 2.3 10*3/uL (ref 0.7–3.1)
Lymphs: 22 %
MCH: 30.9 pg (ref 26.6–33.0)
MCHC: 34.2 g/dL (ref 31.5–35.7)
MCV: 91 fL (ref 79–97)
Monocytes Absolute: 0.6 10*3/uL (ref 0.1–0.9)
Monocytes: 6 %
NEUTROS PCT: 70 %
Neutrophils Absolute: 7.1 10*3/uL — ABNORMAL HIGH (ref 1.4–7.0)
PLATELETS: 323 10*3/uL (ref 150–379)
RBC: 4.33 x10E6/uL (ref 3.77–5.28)
RDW: 16.1 % — AB (ref 12.3–15.4)
WBC: 10.2 10*3/uL (ref 3.4–10.8)

## 2017-04-06 LAB — PROTIME-INR
INR: 0.9 (ref 0.8–1.2)
Prothrombin Time: 9.7 s (ref 9.1–12.0)

## 2017-04-06 LAB — COMPREHENSIVE METABOLIC PANEL
A/G RATIO: 1.7 (ref 1.2–2.2)
ALT: 21 IU/L (ref 0–32)
AST: 19 IU/L (ref 0–40)
Albumin: 4.2 g/dL (ref 3.5–5.5)
Alkaline Phosphatase: 91 IU/L (ref 39–117)
BUN/Creatinine Ratio: 17 (ref 9–23)
BUN: 15 mg/dL (ref 6–24)
Bilirubin Total: <0.2 mg/dL (ref 0.0–1.2)
CALCIUM: 9.3 mg/dL (ref 8.7–10.2)
CO2: 25 mmol/L (ref 20–29)
Chloride: 98 mmol/L (ref 96–106)
Creatinine, Ser: 0.86 mg/dL (ref 0.57–1.00)
GFR calc Af Amer: 94 mL/min/{1.73_m2} (ref >59)
GFR, EST NON AFRICAN AMERICAN: 81 mL/min/{1.73_m2} (ref >59)
GLUCOSE: 142 mg/dL — AB (ref 65–99)
Globulin, Total: 2.5 g/dL (ref 1.5–4.5)
POTASSIUM: 3.8 mmol/L (ref 3.5–5.2)
Sodium: 141 mmol/L (ref 134–144)
TOTAL PROTEIN: 6.7 g/dL (ref 6.0–8.5)

## 2017-04-06 LAB — EPSTEIN-BARR VIRUS VCA ANTIBODY PANEL
EBV EARLY ANTIGEN AB, IGG: 137 U/mL — AB (ref 0.0–8.9)
EBV NA IGG: 62.5 U/mL — AB (ref 0.0–17.9)
EBV VCA IGG: 549 U/mL — AB (ref 0.0–17.9)
EBV VCA IgM: <36 U/mL (ref 0.0–35.9)

## 2017-04-06 LAB — BRAIN NATRIURETIC PEPTIDE: BNP: 26.9 pg/mL (ref 0.0–100.0)

## 2017-04-06 MED ORDER — MAGIC MOUTHWASH: 5.0000 mL | Freq: Three times a day (TID) | ORAL | 0 refills | Status: DC | PRN

## 2017-04-06 NOTE — Telephone Encounter (Signed)
Pt called after going to pharmacy,  Pharmacy told her they did not receive a rx.   [facility] [id number] [place] [address] [facility] [address] [phone] (Phone) [phone] (Fax)

## 2017-04-06 NOTE — Telephone Encounter (Signed)
Script faxed.

## 2017-04-06 NOTE — Telephone Encounter (Signed)
Left message for patient to pick up new script at pharmacy.

## 2017-04-09 ENCOUNTER — Other Ambulatory Visit: Payer: Self-pay | Admitting: Family Medicine

## 2017-04-09 NOTE — Telephone Encounter (Signed)
Patient requesting refill of Loratadine to [facility].  

## 2017-04-10 ENCOUNTER — Encounter: Payer: Self-pay | Admitting: Family Medicine

## 2017-04-10 ENCOUNTER — Ambulatory Visit
Admission: RE | Admit: 2017-04-10 | Discharge: 2017-04-10 | Disposition: A | Discharge status: Home / Self Care | Source: Ambulatory Visit | Attending: Family Medicine | Admitting: Family Medicine

## 2017-04-10 ENCOUNTER — Ambulatory Visit (INDEPENDENT_AMBULATORY_CARE_PROVIDER_SITE_OTHER): Admitting: Family Medicine

## 2017-04-10 VITALS — BP 120/60 | HR 117 | Resp 16 | Ht 63.0 in | Wt 220.2 lb

## 2017-04-10 DIAGNOSIS — B2790 Infectious mononucleosis, unspecified without complication: Secondary | ICD-10-CM

## 2017-04-10 DIAGNOSIS — M3503 Sicca syndrome with myopathy: Secondary | ICD-10-CM

## 2017-04-10 DIAGNOSIS — M79672 Pain in left foot: Secondary | ICD-10-CM

## 2017-04-10 DIAGNOSIS — E8940 Asymptomatic postprocedural ovarian failure: Secondary | ICD-10-CM | POA: Diagnosis not present

## 2017-04-10 DIAGNOSIS — D72828 Other elevated white blood cell count: Secondary | ICD-10-CM | POA: Diagnosis not present

## 2017-04-10 MED ORDER — ESTRADIOL 0.5 MG PO TABS: 0.5000 mg | ORAL_TABLET | Freq: Every day | ORAL | 3 refills | Status: DC

## 2017-04-10 MED ORDER — LIDOCAINE VISCOUS 2 % MT SOLN: 20.0000 mL | OROMUCOSAL | 1 refills | Status: DC | PRN

## 2017-04-10 NOTE — Progress Notes (Signed)
Name: Tracy [name]   MRN: [id number]    DOB: [birth year]   Date:04/10/2017       Progress Note  Subjective  Chief Complaint  Chief Complaint  Patient presents with  . Sore Throat  . Obesity    HPI  Sjogrens: sees Rheumatologist, still has myalgias, dry mouth, dry eyes and daily fatigue  Mono: she was seen last week by Raelyn Ensign, she was having a lot of sore throat, lymphadenopathy, still has easy bruising, and hot flashes. She had elevation of neutrophils ( we will recheck) and EBV titer showed re-activated infection. She is doing better now, but still has sore throat and feels tired all the time anyway. She also has headache ( past few weeks) mild nagging.   Obesity: insurance did cover medication for weight loss, advised her to call insurance to find out what is covered.   Acute Left foot pain: she states about one week ago, no trauma, pain on left lateral column, no redness, increase in warmth or swelling.   Patient Active Problem List   Diagnosis Date Noted  . Elevated uric acid in blood 04/28/2016  . Degenerative disc disease, lumbar 05/26/2015  . Lumbar herniated disc 05/26/2015  . Connective tissue disorder ([place]) 12/09/2014  . 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 Fibromyalgia 12/09/2014  . Abnormal ECG 12/08/2014  . Abnormal antinuclear antibody titer 12/08/2014  . Major depression, chronic 12/08/2014  . Edema leg 12/08/2014  . 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 CFIDS (chronic fatigue and immune dysfunction syndrome) ([place]) 12/08/2014  . 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 Dyslipidemia 12/08/2014  . Family history of breast cancer 12/08/2014  . H/O: hysterectomy 12/08/2014  . Dysmetabolic syndrome 67/20/9470  . Obesity (BMI 30-39.9) 12/08/2014  . Tobacco abuse 12/08/2014  . Disorder of tendon 12/08/2014  . Sjogren's syndrome ([place]) 12/08/2014  . Allergic rhinitis, seasonal 12/08/2014  . Snores 12/08/2014  . Tenosynovitis of thumb 12/02/2014    Social  History   Tobacco Use  . Smoking status: Former Smoker    Packs/day: 0.75    Years: 28.00    Pack years: 21.00    Types: Cigarettes    Start date: 12/08/1988    Last attempt to quit: 12/11/2016    Years since quitting: 0.3  . Smokeless tobacco: Never Used  Substance Use Topics  . Alcohol use: No    Alcohol/week: 0.0 oz     Current Outpatient Medications:  .  amitriptyline (ELAVIL) 25 MG tablet, Take by mouth., Disp: , Rfl:  .  aspirin 81 MG tablet, Take by mouth., Disp: , Rfl:  .  betamethasone dipropionate (DIPROLENE) 0.05 % cream, BETAMETHASONE DIPROPIONATE, 0.05% (External Cream) - Historical Medication  apply twice a day as needed (0.05 %) Active Comments: Medication taken as needed. , Disp: , Rfl:  .  Biotin (BIOTIN 5000) 5 MG CAPS, Take by mouth., Disp: , Rfl:  .  Chlorzoxazone (LORZONE) 375 MG TABS, Take 375 mg by mouth 3 (three) times daily., Disp: , Rfl:  .  Cholecalciferol (VITAMIN D) 2000 UNITS tablet, Take by mouth., Disp: , Rfl:  .  cyclobenzaprine (FLEXERIL) 5 MG tablet, Take 1 tablet by mouth at bedtime., Disp: , Rfl: 0 .  cycloSPORINE (RESTASIS) 0.05 % ophthalmic emulsion, 1 drop 2 (two) times daily., Disp: , Rfl:  .  dextroamphetamine (DEXTROSTAT) 10 MG tablet, TK 1 T PO TID, Disp: , Rfl: 0 .  Dextroamphetamine Sulfate (DEXTROSTAT PO), Take 1 tablet by mouth 2 (two) times daily. Takes 2  tablets in the morning and 1 in the afternoon, Disp: , Rfl:  .  DULoxetine (CYMBALTA) 60 MG capsule, TAKE 2 CAPSULES DAILY, Disp: 180 capsule, Rfl: 1 .  estradiol (ESTRACE) 0.5 MG tablet, Take 1 tablet (0.5 mg total) by mouth daily., Disp: 90 tablet, Rfl: 3 .  fluticasone (FLONASE ALLERGY RELIEF) 50 MCG/ACT nasal spray, Place 2 sprays into both nostrils daily., Disp: 48 g, Rfl: 1 .  furosemide (LASIX) 40 MG tablet, Take by mouth., Disp: , Rfl:  .  hydroxychloroquine (PLAQUENIL) 200 MG tablet, Take by mouth., Disp: , Rfl:  .  lidocaine (XYLOCAINE) 2 % solution, Use as directed 20 mLs in  the mouth or throat as needed for mouth pain., Disp: 200 mL, Rfl: 1 .  loratadine (CLARITIN) 10 MG tablet, TAKE 1 TABLET DAILY, Disp: 90 tablet, Rfl: 0 .  metaxalone (SKELAXIN) 800 MG tablet, , Disp: , Rfl:  .  Multiple Vitamins-Minerals (MULTI VITAMIN/MINERALS) TABS, Take by mouth., Disp: , Rfl:  .  nabumetone (RELAFEN) 750 MG tablet, Take by mouth., Disp: , Rfl:  .  NARCAN 4 MG/0.1ML LIQD nasal spray kit, , Disp: , Rfl:  .  Oxycodone HCl 10 MG TABS, Take 1 tablet by mouth 5 (five) times daily., Disp: , Rfl:  .  potassium chloride SA (KLOR-CON M20) 20 MEQ tablet, Take 1 tablet (20 mEq total) by mouth 2 (two) times daily., Disp: 180 tablet, Rfl: 0 .  pravastatin (PRAVACHOL) 40 MG tablet, TAKE 1 TABLET EVERY EVENING FOR CHOLESTEROL, Disp: 90 tablet, Rfl: 1 .  ranitidine (ZANTAC) 150 MG tablet, Take 1 tablet (150 mg total) 2 (two) times daily by mouth., Disp: 180 tablet, Rfl: 1 .  ULORIC 40 MG tablet, , Disp: , Rfl:  .  Vitamin D, Ergocalciferol, (DRISDOL) 50000 units CAPS capsule, , Disp: , Rfl:   Allergies  Allergen Reactions  . Tomato Hives  . Gabapentin Other (See Comments) and Swelling  . Meperidine   . Tramadol     ROS  Constitutional: Negative for fever or weight change.  Respiratory: Negative for cough and shortness of breath.   Cardiovascular: Negative for chest pain or palpitations.  Gastrointestinal: Negative for abdominal pain, no bowel changes.  Musculoskeletal: Positive  for gait problem but no joint swelling.  Skin: Negative for rash.  Neurological: Negative for dizziness , positive for  headache.  No other specific complaints in a complete review of systems (except as listed in HPI above).  Objective  Vitals:   04/10/17 1046  BP: 120/60  Pulse: (!) 117  Resp: 16  SpO2: 97%  Weight: 220 lb 3.2 oz (99.9 kg)  Height: _0  (1.6 m)    Body mass index is 39.01 kg/m.    Physical Exam  Constitutional: Patient appears well-developed and well-nourished. Obese  No distress.  HEENT: head atraumatic, normocephalic, pupils equal and reactive to light,  neck supple, throat erythema, no petechia or ulceration today  Cardiovascular: Normal rate, regular rhythm and normal heart sounds.  No murmur heard. No BLE edema. Pulmonary/Chest: Effort normal and breath sounds normal. No respiratory distress. Abdominal: Soft.  There is no tenderness. Psychiatric: Patient has a normal mood and affect. behavior is normal. Judgment and thought content normal. Muscular skeletal: pain during palpation of lateral left foot, no redness, over proximal 5th metatarsal bone  Recent Results (from the past 2160 hour(s))  Lipid panel     Status: None   Collection Time: 01/30/17  9:42 AM  Result Value Ref Range  Cholesterol, Total 176 100 - 199 mg/dL   Triglycerides 116 0 - 149 mg/dL   HDL 69 >39 mg/dL   VLDL Cholesterol Cal 23 5 - 40 mg/dL   LDL Calculated 84 0 - 99 mg/dL   Chol/HDL Ratio 2.6 0.0 - 4.4 ratio    Comment:                                   T. Chol/HDL Ratio                                             Men  Women                               1/2 Avg.Risk  3.4    3.3                                   Avg.Risk  5.0    4.4                                2X Avg.Risk  9.6    7.1                                3X Avg.Risk 23.4   11.0   Hemoglobin A1c     Status: None   Collection Time: 01/30/17  9:42 AM  Result Value Ref Range   Hgb A1c MFr Bld 5.6 4.8 - 5.6 %    Comment:          Prediabetes: 5.7 - 6.4          Diabetes: >6.4          Glycemic control for adults with diabetes: <7.0    Est. average glucose Bld gHb Est-mCnc 114 mg/dL  Insulin, random     Status: None   Collection Time: 01/30/17  9:42 AM  Result Value Ref Range   INSULIN 14.3 2.6 - 24.9 uIU/mL  Folate     Status: None   Collection Time: 01/30/17  9:42 AM  Result Value Ref Range   Folate 15.2 >3.0 ng/mL    Comment: A serum folate concentration of less than 3.1 ng/mL is considered to  represent clinical deficiency.   TSH     Status: None   Collection Time: 01/30/17  9:42 AM  Result Value Ref Range   TSH 2.610 0.450 - 4.500 uIU/mL  Vitamin B12     Status: None   Collection Time: 01/30/17  9:42 AM  Result Value Ref Range   Vitamin B-12 262 232 - 1,245 pg/mL  CBC with Differential/Platelet     Status: Abnormal   Collection Time: 04/04/17  3:09 PM  Result Value Ref Range   WBC 10.2 3.4 - 10.8 x10E3/uL   RBC 4.33 3.77 - 5.28 x10E6/uL   Hemoglobin 13.4 11.1 - 15.9 g/dL   Hematocrit 39.2 34.0 - 46.6 %   MCV 91 79 - 97 fL   MCH 30.9 26.6 - 33.0 pg   MCHC 34.2 31.5 - 35.7 g/dL  RDW 16.1 (H) 12.3 - 15.4 %   Platelets 323 150 - 379 x10E3/uL   Neutrophils 70 Not Estab. %   Lymphs 22 Not Estab. %   Monocytes 6 Not Estab. %   Eos 1 Not Estab. %   Basos 0 Not Estab. %   Neutrophils Absolute 7.1 (H) 1.4 - 7.0 x10E3/uL   Lymphocytes Absolute 2.3 0.7 - 3.1 x10E3/uL   Monocytes Absolute 0.6 0.1 - 0.9 x10E3/uL   EOS (ABSOLUTE) 0.1 0.0 - 0.4 x10E3/uL   Basophils Absolute 0.0 0.0 - 0.2 x10E3/uL   Immature Granulocytes 1 Not Estab. %   Immature Grans (Abs) 0.1 0.0 - 0.1 x10E3/uL  Comprehensive metabolic panel     Status: Abnormal   Collection Time: 04/04/17  3:09 PM  Result Value Ref Range   Glucose 142 (H) 65 - 99 mg/dL   BUN 15 6 - 24 mg/dL   Creatinine, Ser 0.86 0.57 - 1.00 mg/dL   GFR calc non Af Amer 81 >59 mL/min/1.73   GFR calc Af Amer 94 >59 mL/min/1.73   BUN/Creatinine Ratio 17 9 - 23   Sodium 141 134 - 144 mmol/L   Potassium 3.8 3.5 - 5.2 mmol/L   Chloride 98 96 - 106 mmol/L   CO2 25 20 - 29 mmol/L   Calcium 9.3 8.7 - 10.2 mg/dL   Total Protein 6.7 6.0 - 8.5 g/dL   Albumin 4.2 3.5 - 5.5 g/dL   Globulin, Total 2.5 1.5 - 4.5 g/dL   Albumin/Globulin Ratio 1.7 1.2 - 2.2   Bilirubin Total <0.2 0.0 - 1.2 mg/dL   Alkaline Phosphatase 91 39 - 117 IU/L   AST 19 0 - 40 IU/L   ALT 21 0 - 32 IU/L  Epstein-Barr virus VCA antibody panel     Status: Abnormal    Collection Time: 04/04/17  3:09 PM  Result Value Ref Range   EBV VCA IgM <36.0 0.0 - 35.9 U/mL    Comment:                                  Negative        <36.0                                  Equivocal 36.0 - 43.9                                  Positive        >43.9    EBV Early Antigen Ab, IgG 137.0 (H) 0.0 - 8.9 U/mL    Comment:                                  Negative        < 9.0                                  Equivocal  9.0 - 10.9                                  Positive        >10.9  EBV VCA IgG 549.0 (H) 0.0 - 17.9 U/mL    Comment:                                  Negative        <18.0                                  Equivocal 18.0 - 21.9                                  Positive        >21.9    EBV NA IgG 62.5 (H) 0.0 - 17.9 U/mL    Comment:                                  Negative        <18.0                                  Equivocal 18.0 - 21.9                                  Positive        >21.9    Interpretation: Comment     Comment:                EBV Interpretation Chart Interpretation   EBV-IgM  EA(D)-IgG  VCA-IgG  EBNA-IgG EBV Seronegative    -        -         -          - Early Phase         +        -         -          - Acute Primary       +       +or-       +          - Infection Convalescence/Past  -       +or-       +          + Infection Reactivated        +or-      +         +          + Infection        + Antibody Present      - Antibody Absent   Protime-INR     Status: None   Collection Time: 04/04/17  3:09 PM  Result Value Ref Range   INR 0.9 0.8 - 1.2    Comment: Reference interval is for non-anticoagulated patients. Suggested INR therapeutic range for Vitamin K antagonist therapy:    Standard Dose (moderate intensity                   therapeutic range):       2.0 - 3.0    Higher intensity therapeutic range       2.5 - 3.5    Prothrombin Time  9.7 9.1 - 12.0 sec  Brain natriuretic peptide     Status: None   Collection  Time: 04/04/17  3:09 PM  Result Value Ref Range   BNP 26.9 0.0 - 100.0 pg/mL     Assessment & Plan  1. 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 Mononucleosis  - lidocaine (XYLOCAINE) 2 % solution; Use as directed 20 mLs in the mouth or throat as needed for mouth pain.  Dispense: 200 mL; Refill: 1  3. Acute neutrophilia  - CBC  4. Acute foot pain, left  - DG Foot Complete Left; Future  5. Surgical menopause  - estradiol (ESTRACE) 0.5 MG tablet; Take 1 tablet (0.5 mg total) by mouth daily.  Dispense: 90 tablet; Refill: 3 She needs refill, rx was given to two daily

## 2017-04-10 NOTE — Addendum Note (Signed)
Addended by: Phineas SemenJOHNSON, Roper Tolson L on: 04/10/2017 11:50 AM   Modules accepted: Orders

## 2017-05-22 ENCOUNTER — Ambulatory Visit (INDEPENDENT_AMBULATORY_CARE_PROVIDER_SITE_OTHER): Admitting: Family Medicine

## 2017-05-22 ENCOUNTER — Encounter: Payer: Self-pay | Admitting: Family Medicine

## 2017-05-22 VITALS — BP 118/80 | HR 109 | Resp 14 | Ht 63.0 in | Wt 223.0 lb

## 2017-05-22 DIAGNOSIS — R5382 Chronic fatigue, unspecified: Secondary | ICD-10-CM

## 2017-05-22 DIAGNOSIS — M2550 Pain in unspecified joint: Secondary | ICD-10-CM | POA: Diagnosis not present

## 2017-05-22 DIAGNOSIS — E8881 Metabolic syndrome: Secondary | ICD-10-CM

## 2017-05-22 DIAGNOSIS — R600 Localized edema: Secondary | ICD-10-CM

## 2017-05-22 DIAGNOSIS — E785 Hyperlipidemia, unspecified: Secondary | ICD-10-CM

## 2017-05-22 DIAGNOSIS — G9332 Myalgic encephalomyelitis/chronic fatigue syndrome: Secondary | ICD-10-CM

## 2017-05-22 DIAGNOSIS — M797 Fibromyalgia: Secondary | ICD-10-CM | POA: Diagnosis not present

## 2017-05-22 DIAGNOSIS — M5126 Other intervertebral disc displacement, lumbar region: Secondary | ICD-10-CM | POA: Diagnosis not present

## 2017-05-22 DIAGNOSIS — E538 Deficiency of other specified B group vitamins: Secondary | ICD-10-CM

## 2017-05-22 DIAGNOSIS — F331 Major depressive disorder, recurrent, moderate: Secondary | ICD-10-CM

## 2017-05-22 DIAGNOSIS — E790 Hyperuricemia without signs of inflammatory arthritis and tophaceous disease: Secondary | ICD-10-CM

## 2017-05-22 DIAGNOSIS — D8989 Other specified disorders involving the immune mechanism, not elsewhere classified: Secondary | ICD-10-CM

## 2017-05-22 DIAGNOSIS — K219 Gastro-esophageal reflux disease without esophagitis: Secondary | ICD-10-CM | POA: Diagnosis not present

## 2017-05-22 DIAGNOSIS — M3503 Sicca syndrome with myopathy: Secondary | ICD-10-CM

## 2017-05-22 DIAGNOSIS — M359 Systemic involvement of connective tissue, unspecified: Secondary | ICD-10-CM

## 2017-05-22 MED ORDER — POTASSIUM CHLORIDE CRYS ER 20 MEQ PO TBCR: 20.0000 meq | EXTENDED_RELEASE_TABLET | Freq: Two times a day (BID) | ORAL | 0 refills | Status: DC

## 2017-05-22 MED ORDER — RANITIDINE HCL 150 MG PO TABS: 150.0000 mg | ORAL_TABLET | Freq: Two times a day (BID) | ORAL | 1 refills | Status: DC

## 2017-05-22 MED ORDER — FUROSEMIDE 40 MG PO TABS: 40.0000 mg | ORAL_TABLET | Freq: Every day | ORAL | 1 refills | Status: DC

## 2017-05-22 MED ORDER — PRAVASTATIN SODIUM 40 MG PO TABS: ORAL_TABLET | ORAL | 1 refills | Status: DC

## 2017-05-22 NOTE — Progress Notes (Signed)
b12 Name: Tracy [name]   MRN: [id number]    DOB: 08/16/1970   Date:05/22/2017       Progress Note  Subjective  Chief Complaint  Chief Complaint  Patient presents with  . Sjogren's Syndrome  . Fibromyalgia    HPI  Sjogrens Sees Rheumatologist- last appointment 04/2017 Dr. 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 Shoulder pain has been ongoing for a year has had injections in the past.   Rotator cuff tear in Right Shoulder MRI completed. Will request notes. Patient endorses pain over a year. Limited movement due to pain. Injection did not really help. Plan for surgery on 3/26 with Dr. Christella Hartigan.   Bulging Discs/Osteoarthritis/sciatic nerve pain  Received back injection 3/6, with moderate relief of back pain. next appointment in two weeks for update on pain management prior to surgery. Patient endorses lower back pain, radiates down right leg. Described as a burning pain. She is unable to do repetitive motions, or stand for longer then a few hours. Patient endorses some weakness when getting up at times.   Obesity Insurance wont cover any weight loss medications. Patient states she has tried metformin but was having uncontrollable diarrhea. Diet: states tries to follow autoimmune diet (avoiding organ meats, nightshade vegetables) cutting down pastas   Acute Left foot pain Noted left foot pain started mid January. no trauma, pain on left lateral column, no redness, increase in warmth or swelling. X-ray completed on 1/29 showing No acute or subacute osseous abnormality. Patient states pain has improved.   Paresthesia Follows up with [facility]- Dr. Ernst Bowler to test for small-fiber neuropathy. Endorses tingling in bilateral fingers and toes. Patient sts has some  shooting/burning pinch throughout body as well.   FMS/Connective Tissue Disorder she is always in pain, she goes to pain clinic at [facility].  she has pain all the time, she also has recurrent cervical lymphadenopathy and periods of sore throat, explained that is common for patients with CFS  Metabolic Syndrome she denies polyphagia, no Polydipsia, but sips on water because of Sjogren's, she has polyuria from lasix. Not following a diabetic diet, and off Metformin. Last hgbA1C was 5.6% back in Nov 2018  Dyslipidemia she is taking Pravastatin, dose increased on her last visit, no side effects - she has FMS, but does not think her muscle aches is from statin. Last labs showed LDL 84 , HDL was 69.      Patient Active Problem List   Diagnosis Date Noted  . Elevated uric acid in blood 04/28/2016  . Degenerative disc disease, lumbar 05/26/2015  . Lumbar herniated disc 05/26/2015  . Connective tissue disorder ([place]) 12/09/2014  . 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 Fibromyalgia 12/09/2014  . Abnormal ECG 12/08/2014  . Abnormal antinuclear antibody titer 12/08/2014  . Major depression, chronic 12/08/2014  . Edema leg 12/08/2014  . 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 Dyslipidemia 12/08/2014  . Family history of breast cancer 12/08/2014  . H/O: hysterectomy 12/08/2014  . Dysmetabolic syndrome 50/53/9767  . Obesity (BMI 30-39.9) 12/08/2014  . Tobacco abuse 12/08/2014  . Disorder of tendon 12/08/2014  . Sjogren's syndrome ([place]) 12/08/2014  . Allergic rhinitis, seasonal 12/08/2014  . Snores 12/08/2014  . Tenosynovitis  of thumb 12/02/2014    Social History   Tobacco Use  . Smoking status: Former Smoker    Packs/day: 0.75    Years: 28.00    Pack years: 21.00    Types: Cigarettes    Start date: 12/08/1988    Last attempt to quit: 12/11/2016    Years since quitting:  0.4  . Smokeless tobacco: Never Used  Substance Use Topics  . Alcohol use: No    Alcohol/week: 0.0 oz     Current Outpatient Medications:  .  amitriptyline (ELAVIL) 25 MG tablet, Take by mouth., Disp: , Rfl:  .  aspirin 81 MG tablet, Take by mouth., Disp: , Rfl:  .  betamethasone dipropionate (DIPROLENE) 0.05 % cream, BETAMETHASONE DIPROPIONATE, 0.05% (External Cream) - Historical Medication  apply twice a day as needed (0.05 %) Active Comments: Medication taken as needed. , Disp: , Rfl:  .  Biotin (BIOTIN 5000) 5 MG CAPS, Take by mouth., Disp: , Rfl:  .  Chlorzoxazone (LORZONE) 375 MG TABS, Take 375 mg by mouth 3 (three) times daily., Disp: , Rfl:  .  Cholecalciferol (VITAMIN D) 2000 UNITS tablet, Take by mouth., Disp: , Rfl:  .  cyclobenzaprine (FLEXERIL) 5 MG tablet, Take 1 tablet by mouth at bedtime., Disp: , Rfl: 0 .  cycloSPORINE (RESTASIS) 0.05 % ophthalmic emulsion, 1 drop 2 (two) times daily., Disp: , Rfl:  .  dextroamphetamine (DEXTROSTAT) 10 MG tablet, TK 1 T PO TID, Disp: , Rfl: 0 .  Dextroamphetamine Sulfate (DEXTROSTAT PO), Take 1 tablet by mouth 2 (two) times daily. Takes 2 tablets in the morning and 1 in the afternoon, Disp: , Rfl:  .  DULoxetine (CYMBALTA) 60 MG capsule, TAKE 2 CAPSULES DAILY, Disp: 180 capsule, Rfl: 1 .  estradiol (ESTRACE) 0.5 MG tablet, Take 1 tablet (0.5 mg total) by mouth daily., Disp: 90 tablet, Rfl: 3 .  fluticasone (FLONASE ALLERGY RELIEF) 50 MCG/ACT nasal spray, Place 2 sprays into both nostrils daily., Disp: 48 g, Rfl: 1 .  furosemide (LASIX) 40 MG tablet, Take by mouth., Disp: , Rfl:  .  hydroxychloroquine (PLAQUENIL) 200 MG tablet, Take by mouth., Disp: , Rfl:  .  lidocaine (XYLOCAINE) 2 % solution, Use as directed 20 mLs in the mouth or throat as needed for mouth pain., Disp: 200 mL, Rfl: 1 .  metaxalone (SKELAXIN) 800 MG tablet, , Disp: , Rfl:  .  Multiple Vitamins-Minerals (MULTI VITAMIN/MINERALS) TABS, Take by mouth., Disp: , Rfl:  .   nabumetone (RELAFEN) 750 MG tablet, Take by mouth., Disp: , Rfl:  .  NARCAN 4 MG/0.1ML LIQD nasal spray kit, , Disp: , Rfl:  .  Oxycodone HCl 10 MG TABS, Take 1 tablet by mouth 5 (five) times daily., Disp: , Rfl:  .  potassium chloride SA (KLOR-CON M20) 20 MEQ tablet, Take 1 tablet (20 mEq total) by mouth 2 (two) times daily., Disp: 180 tablet, Rfl: 0 .  pravastatin (PRAVACHOL) 40 MG tablet, TAKE 1 TABLET EVERY EVENING FOR CHOLESTEROL, Disp: 90 tablet, Rfl: 1 .  ranitidine (ZANTAC) 150 MG tablet, Take 1 tablet (150 mg total) 2 (two) times daily by mouth., Disp: 180 tablet, Rfl: 1 .  ULORIC 40 MG tablet, , Disp: , Rfl:  .  Vitamin D, Ergocalciferol, (DRISDOL) 50000 units CAPS capsule, , Disp: , Rfl:  .  loratadine (CLARITIN) 10 MG tablet, TAKE 1 TABLET DAILY (Patient not taking: Reported on 05/22/2017), Disp: 90 tablet, Rfl: 0  Allergies  Allergen Reactions  . Tomato  Hives  . Gabapentin Other (See Comments) and Swelling  . Meperidine   . Tramadol     ROS   Constitutional: Negative for fever or weight change.  Respiratory: Negative for cough , positive for shortness of breath with activity.   Cardiovascular: Negative for chest pain or palpitations.  Gastrointestinal: Negative for abdominal pain, no bowel changes.  Musculoskeletal: positive  for gait problem and intermittent  joint swelling.  Skin: Negative for rash.  Neurological: Negative for dizziness or headache.  No other specific complaints in a complete review of systems (except as listed in HPI above).  Objective  Vitals:   05/22/17 1354 05/22/17 1356  BP: 136/86 118/80  Pulse: (!) 109   Resp: 14   SpO2: 98%   Weight: 223 lb (101.2 kg)   Height: '5\' 3"'  (1.6 m)     Body mass index is 39.5 kg/m.   Physical Exam  Constitutional: Patient appears well-developed and well-nourished. Obese No distress.  HEENT: head atraumatic, normocephalic, pupils equal and reactive to light,neck supple, throat within normal limits,  enlarged parotid on left side, seeing Dr. Ladene Artist Cardiovascular: Normal rate, regular rhythm and normal heart sounds.  No murmur heard. No BLE edema. Pulmonary/Chest: Effort normal and breath sounds normal. No respiratory distress. Abdominal: Soft.  There is no tenderness. Psychiatric: Patient has a normal mood and affect. behavior is normal. Judgment and thought content normal.  No results found for this or any previous visit (from the past 72 hour(s)).  Assessment & Plan  1. Sjogren's syndrome with myopathy ([place])  Keep follow up with Rheumatologist  2. 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 Moderate episode of recurrent major depressive disorder ([place])  Continue cymbalta- well controlled presently. Let us know if things change or you need counseling or psychiatry refferal  4. B12 deficiency  Will check levels today and we may need to start your on IM injections if its still low  5. Fibromyalgia  Follow up with pain clinic  6. Dysmetabolic syndrome  Last lipids and A1C were good. Discussed diet.   7. Elevated uric acid in blood   8. Dyslipidemia  Lipids well controlled, continue statin  - pravastatin (PRAVACHOL) 40 MG tablet; TAKE 1 TABLET EVERY EVENING FOR CHOLESTEROL  Dispense: 90 tablet; Refill: 1  9. Connective tissue disorder ([place]) - cont f/up with rheum/ ortho  10. Lumbar herniated disc  - cont f/up with ortho  11. Arthralgia, unspecified joint - cont f/up with rheum & ortho  12. GERD without esophagitis  Avoid acidic, fatty foods - ranitidine (ZANTAC) 150 MG tablet; Take 1 tablet (150 mg total) by mouth 2 (two) times daily.  Dispense: 180 tablet; Refill: 1  13. Bilateral lower extremity edema  stable - potassium chloride SA (KLOR-CON M20) 20 MEQ tablet; Take 1 tablet (20 mEq total) by mouth 2 (two) times daily.  Dispense: 180 tablet; Refill: 0 - furosemide (LASIX) 40 MG tablet; Take 1  tablet (40 mg total) by mouth daily.  Dispense: 90 tablet; Refill: 1

## 2017-06-14 LAB — VITAMIN B12: Vitamin B-12: 370 pg/mL (ref 232–1245)

## 2017-06-20 ENCOUNTER — Other Ambulatory Visit: Payer: Self-pay | Admitting: Family Medicine

## 2017-06-20 NOTE — Telephone Encounter (Signed)
Refill request for general medication: Loratadine 10 mg  Last office visit: 05/22/2017   Last physical exam: 01/26/2017  Follow-ups on file. 08/22/2017

## 2017-06-20 NOTE — Telephone Encounter (Signed)
If patient has Sjogren's, I'm going to suggest that she stop the anti-histamine if she's using it for allergic rhinitis and use Singulair (montelukast) instead Please call and ask her if she has ever tried Singulair; I'd like to have her use this instead if she agrees with trying the adjustment Thank you

## 2017-06-21 MED ORDER — MONTELUKAST SODIUM 10 MG PO TABS: 10.0000 mg | ORAL_TABLET | Freq: Every day | ORAL | 0 refills | Status: DC

## 2017-06-21 NOTE — Telephone Encounter (Signed)
Patient called.  Unable to reach patient. If patient calls back please inform of the message below from Dr. Sherie DonLada.

## 2017-06-21 NOTE — Addendum Note (Signed)
Addended by: Nyeem Stoke, Janit BernMELINDA P on: 06/21/2017 01:24 PM   Modules accepted: Orders

## 2017-07-06 ENCOUNTER — Telehealth: Payer: Self-pay

## 2017-07-06 NOTE — Telephone Encounter (Signed)
Copied from CRM [id number]. Topic: General - Other >> Jul 06, 2017  1:21 PM Gerrianne ScalePayne, Angela L wrote: Reason for CRM: Danella DeisVivek from [employer] [phone] is calling to check on status on disability form that was faxed on 06-21-17 the reference number is [id number]   they will refax today

## 2017-07-06 NOTE — Telephone Encounter (Signed)
Paper work is very detailed and we have never filled it before. Could not find it on her chart. I recommended to refer her to PT or ask her neurologist to fill it out. Otherwise I will need to see her, but my schedule it full. Not sure when we can get her in.

## 2017-07-06 NOTE — Telephone Encounter (Signed)
Spoke with patient and she states [employer] sent this form to all her physicians- Dr. Carlynn PurlSowles wants PT or Neurologist to fill this paperwork out-informed [employer] and the patient. Also patient wanted us to know she does not request Loratadine or Singular from [facility] they instead just sent them. Please D/C both medications. If patient has any further problems with sinuitis she will call her [place] [facility] [facility] physician Dr. Cammy CopaPaul H. Juengel.

## 2017-07-06 NOTE — Telephone Encounter (Signed)
Copied from CRM #[id number]. Topic: General - Other >> Jul 06, 2017  1:21 PM Payne, Angela L wrote: Reason for CRM: Vivek from [employer] [phone] is calling to check on status on disability form that was faxed on 06-21-17 the reference number is [id number]   they will refax today  

## 2017-07-06 NOTE — Telephone Encounter (Signed)
Have you seen this paperwork or started working on it?

## 2017-08-21 ENCOUNTER — Other Ambulatory Visit: Payer: Self-pay | Admitting: Family Medicine

## 2017-08-21 DIAGNOSIS — R600 Localized edema: Secondary | ICD-10-CM

## 2017-08-22 ENCOUNTER — Ambulatory Visit (INDEPENDENT_AMBULATORY_CARE_PROVIDER_SITE_OTHER): Admitting: Family Medicine

## 2017-08-22 ENCOUNTER — Encounter: Payer: Self-pay | Admitting: Family Medicine

## 2017-08-22 VITALS — BP 130/80 | HR 108 | Resp 16 | Ht 63.0 in | Wt 225.2 lb

## 2017-08-22 DIAGNOSIS — F331 Major depressive disorder, recurrent, moderate: Secondary | ICD-10-CM | POA: Diagnosis not present

## 2017-08-22 DIAGNOSIS — R600 Localized edema: Secondary | ICD-10-CM | POA: Diagnosis not present

## 2017-08-22 DIAGNOSIS — R5382 Chronic fatigue, unspecified: Secondary | ICD-10-CM | POA: Diagnosis not present

## 2017-08-22 DIAGNOSIS — M359 Systemic involvement of connective tissue, unspecified: Secondary | ICD-10-CM

## 2017-08-22 DIAGNOSIS — E538 Deficiency of other specified B group vitamins: Secondary | ICD-10-CM

## 2017-08-22 DIAGNOSIS — E8881 Metabolic syndrome: Secondary | ICD-10-CM | POA: Diagnosis not present

## 2017-08-22 DIAGNOSIS — D8989 Other specified disorders involving the immune mechanism, not elsewhere classified: Secondary | ICD-10-CM

## 2017-08-22 DIAGNOSIS — Z79899 Other long term (current) drug therapy: Secondary | ICD-10-CM | POA: Diagnosis not present

## 2017-08-22 DIAGNOSIS — E8940 Asymptomatic postprocedural ovarian failure: Secondary | ICD-10-CM

## 2017-08-22 DIAGNOSIS — M3503 Sicca syndrome with myopathy: Secondary | ICD-10-CM

## 2017-08-22 DIAGNOSIS — E785 Hyperlipidemia, unspecified: Secondary | ICD-10-CM | POA: Diagnosis not present

## 2017-08-22 DIAGNOSIS — L309 Dermatitis, unspecified: Secondary | ICD-10-CM | POA: Diagnosis not present

## 2017-08-22 DIAGNOSIS — E559 Vitamin D deficiency, unspecified: Secondary | ICD-10-CM

## 2017-08-22 MED ORDER — DULOXETINE HCL 60 MG PO CPEP: 120.0000 mg | ORAL_CAPSULE | Freq: Every day | ORAL | 1 refills | Status: DC

## 2017-08-22 MED ORDER — FUROSEMIDE 40 MG PO TABS: 40.0000 mg | ORAL_TABLET | Freq: Every day | ORAL | 1 refills | Status: AC

## 2017-08-22 MED ORDER — METFORMIN HCL ER 750 MG PO TB24: 750.0000 mg | ORAL_TABLET | Freq: Every day | ORAL | 1 refills | Status: DC

## 2017-08-22 MED ORDER — PIMECROLIMUS 1 % EX CREA: TOPICAL_CREAM | Freq: Two times a day (BID) | CUTANEOUS | 0 refills | Status: DC

## 2017-08-22 MED ORDER — PRAVASTATIN SODIUM 40 MG PO TABS: ORAL_TABLET | ORAL | 1 refills | Status: DC

## 2017-08-22 MED ORDER — ESTRADIOL 1 MG PO TABS: 1.0000 mg | ORAL_TABLET | Freq: Every day | ORAL | 1 refills | Status: DC

## 2017-08-22 NOTE — Progress Notes (Signed)
Name: Tracy [name]   MRN: [id number]    DOB: [birth year]   Date:08/22/2017       Progress Note  Subjective  Chief Complaint  Chief Complaint  Patient presents with  . Depression  . Sjogren's  . Medication Refill    3 month F/U  . Allergic Rhinitis     She has been having acne outbreaks since lowering her dose of Estrogen.    HPI  Sjogrens Sees Rheumatologist- last appointment 04/2017 Dr. [facility] was, still has myalgias, dry mouth, dry eyes and daily fatigue- She gets Adderal from Dr. Alroy Dust and it helps with fatigue. Currently on Methotrexate and plaquenil for symptoms   Bulging Discs/Osteoarthritis/sciatic nerve pain/chronic pain   Patient endorses lower back pain, radiates down right leg. Described as a burning pain. She is unable to do repetitive motions, or stand for longer then a few hours. Patient endorses some weakness when getting up at times. Also has problems with shoulders and had recent surgery, stable at this time  Obesity Insurance wont cover any weight loss medications. Patient states she has tried metformin but was having uncontrollable diarrhea, but since GLP1 not covered she would like to try metformin ER and monitor. Diet: states tries to follow autoimmune diet (avoiding organ meats, nightshade vegetables) cutting down pastas , trying to walk more often  Paresthesia Follows up with [facility]- Dr. Ernst Bowler to test for small-fiber neuropathy. Endorses tingling in bilateral fingers and toes. Patient sts has some shooting/burning pinch throughout body as well. Stable, cannot tolerate gabapentin.   FMS/Connective Tissue Disorder she is always in pain, she goes to pain clinic at [facility].  she has pain all the time,she also has recurrent cervical lymphadenopathy and periods of sore throat, taking pain medication and is stable  Metabolic Syndrome she denies polyphagia, no polydipsia, but sips on water because of Sjogren's, she has polyuria  from lasix. Not following a diabetic diet, and off Metformin.Last hgbA1C was 5.6% back in Nov 2018, we will recheck labs today, resume metformin   Dyslipidemia  she is taking Pravastatin, dose increased on her last visit, no side effects - she has FMS, but does not think her muscle aches is from statin.Last labs showed LDL 84 , HDL was 69. we will recheck labs  Major Depression  She is doing well on medication, states fatigue and lack of energy and motivation but states from other medication problems, she states no need to see psychiatrist.   Surgical menopause:   We decreased dose of estrogen on her last visit and is now causing her to have acne, she would like to try going back on previous dose. Also having worsening of rash on arms, eczema and we will try elidel Reminded her of long term use risk with high dose estrogen   Patient Active Problem List   Diagnosis Date Noted  . Moderate episode of recurrent major depressive disorder ([place]) 05/22/2017  . Elevated uric acid in blood 04/28/2016  . Degenerative disc disease, lumbar 05/26/2015  . Lumbar herniated disc 05/26/2015  . Connective tissue disorder ([place]) 12/09/2014  . 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 Fibromyalgia 12/09/2014  . Abnormal ECG 12/08/2014  . Abnormal antinuclear antibody titer 12/08/2014  . Major depression, chronic 12/08/2014  . Edema leg 12/08/2014  . 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 CFIDS (chronic fatigue and immune dysfunction syndrome) ([place]) 12/08/2014  . 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 Dyslipidemia 12/08/2014  . Family history of breast cancer 12/08/2014  . H/O:  hysterectomy 12/08/2014  . Dysmetabolic syndrome 40/98/1191  . Obesity (BMI 30-39.9) 12/08/2014  . Tobacco abuse 12/08/2014  . Disorder of tendon 12/08/2014  . Sjogren's syndrome ([place]) 12/08/2014  . Allergic rhinitis, seasonal 12/08/2014  . Snores 12/08/2014  . Tenosynovitis of thumb 12/02/2014    Past Surgical History:  Procedure  Laterality Date  . ABDOMINAL HYSTERECTOMY    . BREAST REDUCTION SURGERY Bilateral 2000   [facility]  . CARPAL TUNNEL RELEASE Right 12/16/2015  . CARPAL TUNNEL RELEASE Left 04/18/2016   also left thumb replacement  . CHOLECYSTECTOMY    . ENDOMETRIAL ABLATION     ovarian cyst removal  . JOINT REPLACEMENT Right 12/16/2015   right thumb  . NASAL SINUS SURGERY    . REDUCTION MAMMAPLASTY  2000   september  . TUBAL LIGATION      Family History  Problem Relation Age of Onset  . Hypertension Mother   . Allergic rhinitis Mother   . Hypertension Father   . Alcohol abuse Father   . Heart disease Father   . Alcohol abuse Brother   . Hypertension Brother   . Breast cancer Maternal Aunt 30       two aunts  . Breast cancer Paternal Aunt   . Breast cancer Maternal Grandmother   . Breast cancer Cousin 65       paternal cousin    Social History   Socioeconomic History  . Marital status: Married    Spouse name: Glendell Docker  . Number of children: 1  . Years of education: Not on file  . Highest education level: Some college, no degree  Occupational History  . Occupation: disabled    Comment: used to work at [employer]  . Financial resource strain: Not hard at all  . Food insecurity:    Worry: Never true    Inability: Never true  . Transportation needs:    Medical: No    Non-medical: No  Tobacco Use  . Smoking status: Former Smoker    Packs/day: 0.75    Years: 28.00    Pack years: 21.00    Types: Cigarettes    Start date: 12/08/1988    Last attempt to quit: 12/11/2016    Years since quitting: 0.6  . Smokeless tobacco: Never Used  Substance and Sexual Activity  . Alcohol use: No    Alcohol/week: 0.0 oz  . Drug use: No  . Sexual activity: Yes    Partners: Male    Birth control/protection: Other-see comments    Comment: Hysterectomy  Lifestyle  . Physical activity:    Days per week: 2 days    Minutes per session: 20 min  . Stress: Not at all  Relationships  .  Social connections:    Talks on phone: More than three times a week    Gets together: Never    Attends religious service: Never    Active member of club or organization: No    Attends meetings of clubs or organizations: Never    Relationship status: Married  . Intimate partner violence:    Fear of current or ex partner: No    Emotionally abused: No    Physically abused: No    Forced sexual activity: No  Other Topics Concern  . Not on file  Social History Narrative   Married, she has a grown daughter that has autism.    She lost her job at [employer] on 10/22/2015 ( worker there for 10 years), after she used all  her FMLA for medical problems. She was fired the day after her short term disability ran out and she was getting ready to have carpal tunnel repair and right thumb replacement.      Current Outpatient Medications:  .  amitriptyline (ELAVIL) 25 MG tablet, Take by mouth., Disp: , Rfl:  .  amphetamine-dextroamphetamine (ADDERALL) 10 MG tablet, Take 1-20 tablets by mouth 2 (two) times daily., Disp: , Rfl:  .  aspirin 81 MG tablet, Take by mouth., Disp: , Rfl:  .  betamethasone dipropionate (DIPROLENE) 0.05 % cream, BETAMETHASONE DIPROPIONATE, 0.05% (External Cream) - Historical Medication  apply twice a day as needed (0.05 %) Active Comments: Medication taken as needed. , Disp: , Rfl:  .  Biotin (BIOTIN 5000) 5 MG CAPS, Take by mouth., Disp: , Rfl:  .  cyclobenzaprine (FLEXERIL) 5 MG tablet, Take 1 tablet by mouth at bedtime., Disp: , Rfl: 0 .  cycloSPORINE (RESTASIS) 0.05 % ophthalmic emulsion, 1 drop 2 (two) times daily., Disp: , Rfl:  .  DULoxetine (CYMBALTA) 60 MG capsule, Take 2 capsules (120 mg total) by mouth daily., Disp: 180 capsule, Rfl: 1 .  estradiol (ESTRACE) 1 MG tablet, Take 1 tablet (1 mg total) by mouth daily., Disp: 90 tablet, Rfl: 1 .  folic acid (FOLVITE) 1 MG tablet, Take 1 tablet by mouth daily., Disp: , Rfl:  .  furosemide (LASIX) 40 MG tablet, Take 1 tablet (40  mg total) by mouth daily., Disp: 90 tablet, Rfl: 1 .  hydroxychloroquine (PLAQUENIL) 200 MG tablet, Take by mouth., Disp: , Rfl:  .  lidocaine (XYLOCAINE) 2 % solution, Use as directed 20 mLs in the mouth or throat as needed for mouth pain., Disp: 200 mL, Rfl: 1 .  Multiple Vitamins-Minerals (MULTI VITAMIN/MINERALS) TABS, Take by mouth., Disp: , Rfl:  .  NARCAN 4 MG/0.1ML LIQD nasal spray kit, , Disp: , Rfl:  .  Oxycodone HCl 10 MG TABS, Take 1 tablet by mouth 5 (five) times daily., Disp: , Rfl:  .  pilocarpine (SALAGEN) 5 MG tablet, Take 1 tablet by mouth 3 (three) times daily. For dry mouth, Disp: , Rfl:  .  potassium chloride SA (KLOR-CON M20) 20 MEQ tablet, Take 1 tablet (20 mEq total) by mouth 2 (two) times daily., Disp: 180 tablet, Rfl: 0 .  pravastatin (PRAVACHOL) 40 MG tablet, TAKE 1 TABLET EVERY EVENING FOR CHOLESTEROL, Disp: 90 tablet, Rfl: 1 .  ranitidine (ZANTAC) 150 MG tablet, Take 1 tablet (150 mg total) by mouth 2 (two) times daily., Disp: 180 tablet, Rfl: 1 .  ULORIC 40 MG tablet, , Disp: , Rfl:  .  Vitamin D, Ergocalciferol, (DRISDOL) 50000 units CAPS capsule, , Disp: , Rfl:  .  diclofenac sodium (VOLTAREN) 1 % GEL, Apply 4 g topically daily., Disp: , Rfl:  .  metFORMIN (GLUCOPHAGE XR) 750 MG 24 hr tablet, Take 1 tablet (750 mg total) by mouth daily with breakfast., Disp: 90 tablet, Rfl: 1 .  methotrexate 50 MG/2ML injection, Inject 0.8 mLs into the skin once a week., Disp: , Rfl:  .  pimecrolimus (ELIDEL) 1 % cream, Apply topically 2 (two) times daily., Disp: 100 g, Rfl: 0  Allergies  Allergen Reactions  . Tomato Hives  . Gabapentin Other (See Comments) and Swelling  . Meperidine   . Tramadol      ROS  Constitutional: Negative for fever or weight change.  Respiratory: Negative for cough , but has shortness of breath with activity .   Cardiovascular: Negative  for chest pain or palpitations.  Gastrointestinal: Negative for abdominal pain, no bowel changes.   Musculoskeletal: Negative for gait problem or joint swelling.  Skin: Negative for rash.  Neurological: Negative for dizziness or headache.  No other specific complaints in a complete review of systems (except as listed in HPI above).   Objective  Vitals:   08/22/17 1444  BP: 130/80  Pulse: (!) 108  Resp: 16  SpO2: 98%  Weight: 225 lb 3.2 oz (102.2 kg)  Height: '5\' 3"'  (1.6 m)    Body mass index is 39.89 kg/m.  Physical Exam  Constitutional: Patient appears well-developed and well-nourished. Obese  No distress.  HEENT: head atraumatic, normocephalic, pupils equal and reactive to light, neck supple, throat within normal limits Cardiovascular: Normal rate, regular rhythm and normal heart sounds.  No murmur heard. No BLE edema. Pulmonary/Chest: Effort normal and breath sounds normal. No respiratory distress. Abdominal: Soft.  There is no tenderness. Muscular Skeletal: trigger point positive  Psychiatric: Patient has a normal mood and affect. behavior is normal. Judgment and thought content normal.   Recent Results (from the past 2160 hour(s))  Vitamin B12     Status: None   Collection Time: 06/13/17 11:47 AM  Result Value Ref Range   Vitamin B-12 370 232 - 1,245 pg/mL     PHQ2/9: Depression screen [facility] 2/9 08/22/2017 05/22/2017 01/26/2017 07/12/2016 04/28/2016  Decreased Interest 0 0 0 1 1  Down, Depressed, Hopeless 0 0 0 0 0  PHQ - 2 Score 0 0 0 1 1  Altered sleeping 3 2 - 3 3  Tired, decreased energy 3 3 - 3 3  Change in appetite 3 0 - 0 0  Feeling bad or failure about yourself  1 0 - 0 0  Trouble concentrating 1 1 - 1 0  Moving slowly or fidgety/restless 1 0 - 0 0  Suicidal thoughts 0 0 - 0 0  PHQ-9 Score 12 6 - 8 7  Difficult doing work/chores Very difficult Somewhat difficult - - -     Fall Risk: Fall Risk  08/22/2017 05/22/2017 04/10/2017 01/26/2017 04/28/2016  Falls in the past year? No No No No Yes  Number falls in past yr: - - - - 2 or more  Injury with Fall? -  - - - No  Risk Factor Category  - - - - -  Follow up - - - - -     Functional Status Survey: Is the patient deaf or have difficulty hearing?: No Does the patient have difficulty seeing, even when wearing glasses/contacts?: No Does the patient have difficulty concentrating, remembering, or making decisions?: No Does the patient have difficulty walking or climbing stairs?: No Does the patient have difficulty dressing or bathing?: No Does the patient have difficulty doing errands alone such as visiting a doctor's office or shopping?: No   Assessment & Plan  1. Dyslipidemia  - pravastatin (PRAVACHOL) 40 MG tablet; TAKE 1 TABLET EVERY EVENING FOR CHOLESTEROL  Dispense: 90 tablet; Refill: 1 - Lipid panel  2. Bilateral lower extremity edema  - furosemide (LASIX) 40 MG tablet; Take 1 tablet (40 mg total) by mouth daily.  Dispense: 90 tablet; Refill: 1  3. Surgical menopause  - estradiol (ESTRACE) 1 MG tablet; Take 1 tablet (1 mg total) by mouth daily.  Dispense: 90 tablet; Refill: 1  4. Moderate episode of recurrent major depressive disorder ([place])  She is doing well emotionally a lot of her symptoms are secondary to her chronic fatigue,  FMS and Sjogren's that causes fatigue, lack of energy and sleep disturbance  - DULoxetine (CYMBALTA) 60 MG capsule; Take 2 capsules (120 mg total) by mouth daily.  Dispense: 180 capsule; Refill: 1  5. Sjogren's syndrome with myopathy ([place])  Continue follow up with Rheumatologist   6. CFIDS (chronic fatigue and immune dysfunction syndrome) ([facility])  stable  7. Connective tissue disorder ([place])  stable  8. Dysmetabolic syndrome  She would like to resume metformin  - Hemoglobin A1c - metFORMIN (GLUCOPHAGE XR) 750 MG 24 hr tablet; Take 1 tablet (750 mg total) by mouth daily with breakfast.  Dispense: 90 tablet; Refill: 1  9. Eczema, unspecified type  Uses steroids prn but getting worse we will try elidel if no improvement referral to  dermatologist  - pimecrolimus (ELIDEL) 1 % cream; Apply topically 2 (two) times daily.  Dispense: 100 g; Refill: 0  10. Vitamin D deficiency  - VITAMIN D 25 Hydroxy (Vit-D Deficiency, Fractures)  11. B12 deficiency  - Vitamin B12  12. Long-term use of high-risk medication  - Comprehensive metabolic panel - CBC with Differential/Platelet

## 2017-10-16 ENCOUNTER — Ambulatory Visit: Admitting: Nurse Practitioner

## 2017-10-31 ENCOUNTER — Ambulatory Visit (INDEPENDENT_AMBULATORY_CARE_PROVIDER_SITE_OTHER): Admitting: Family Medicine

## 2017-10-31 ENCOUNTER — Encounter: Payer: Self-pay | Admitting: Family Medicine

## 2017-10-31 VITALS — BP 118/76 | HR 101 | Temp 98.3°F | Resp 14 | Ht 63.0 in | Wt 229.2 lb

## 2017-10-31 DIAGNOSIS — Z23 Encounter for immunization: Secondary | ICD-10-CM

## 2017-10-31 DIAGNOSIS — R0602 Shortness of breath: Secondary | ICD-10-CM | POA: Diagnosis not present

## 2017-10-31 NOTE — Progress Notes (Signed)
Name: Tracy [name]   MRN: [id number]    DOB: [birth year]   Date:10/31/2017       Progress Note  Subjective  Chief Complaint  Chief Complaint  Patient presents with  . Shortness of Breath    HPI  SOB: she was seen by Rheumatologist that suggested follow up with pcp for evaluation of SOB. She has a long history of SOB with activity, very seldom has a cough, she is morbidly obese, but denies wheezing. She only has SOB with activity, occasionally has a soreness on anterior chest , sporadic not associated with activity, but can last up to a day, no associated diaphoresis and it is reproducible when present. She denies orthopnea. She has mild lower extremity edema - puffiness that is chronic. She used an inhaler in the past and took singulair also. She used to smoke but quit about one year ago. She denies worsening of symptoms. She feels it is secondary to increase in humidity and weight gain and is not very concerned about it. She is not physically active , she stopped working Apr 23 2015. Her weight in March 2017 was 211lbs    Patient Active Problem List   Diagnosis Date Noted  . Moderate episode of recurrent major depressive disorder ([place]) 05/22/2017  . Elevated uric acid in blood 04/28/2016  . Degenerative disc disease, lumbar 05/26/2015  . Lumbar herniated disc 05/26/2015  . Connective tissue disorder ([place]) 12/09/2014  . 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 Fibromyalgia 12/09/2014  . Abnormal ECG 12/08/2014  . Abnormal antinuclear antibody titer 12/08/2014  . Major depression, chronic 12/08/2014  . Edema leg 12/08/2014  . 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 CFIDS (chronic fatigue and immune dysfunction syndrome) ([place]) 12/08/2014  . 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 Dyslipidemia 12/08/2014  . Family history of breast cancer 12/08/2014  . H/O: hysterectomy 12/08/2014  . Dysmetabolic syndrome 40/98/1191  . Obesity (BMI 30-39.9) 12/08/2014  . Tobacco abuse 12/08/2014  .  Disorder of tendon 12/08/2014  . Sjogren's syndrome ([place]) 12/08/2014  . Allergic rhinitis, seasonal 12/08/2014  . Snores 12/08/2014  . Tenosynovitis of thumb 12/02/2014    Past Surgical History:  Procedure Laterality Date  . ABDOMINAL HYSTERECTOMY    . BREAST REDUCTION SURGERY Bilateral 2000   [facility]  . CARPAL TUNNEL RELEASE Right 12/16/2015  . CARPAL TUNNEL RELEASE Left 04/18/2016   also left thumb replacement  . CHOLECYSTECTOMY    . ENDOMETRIAL ABLATION     ovarian cyst removal  . JOINT REPLACEMENT Right 12/16/2015   right thumb  . NASAL SINUS SURGERY    . REDUCTION MAMMAPLASTY  2000   september  . TUBAL LIGATION      Family History  Problem Relation Age of Onset  . Hypertension Mother   . Allergic rhinitis Mother   . Hypertension Father   . Alcohol abuse Father   . Heart disease Father   . Alcohol abuse Brother   . Hypertension Brother   . Breast cancer Maternal Aunt 30       two aunts  . Breast cancer Paternal Aunt   . Breast cancer Maternal Grandmother   . Breast cancer Cousin 61       paternal cousin    Social History   Socioeconomic History  . Marital status: Married    Spouse name: Glendell Docker  . Number of children: 1  . Years of education: Not on file  . Highest education level: Some college, no degree  Occupational History  . Occupation:  disabled    Comment: used to work at [employer]  . Financial resource strain: Not hard at all  . Food insecurity:    Worry: Never true    Inability: Never true  . Transportation needs:    Medical: No    Non-medical: No  Tobacco Use  . Smoking status: Former Smoker    Packs/day: 0.75    Years: 28.00    Pack years: 21.00    Types: Cigarettes    Start date: 12/08/1988    Last attempt to quit: 12/11/2016    Years since quitting: 0.8  . Smokeless tobacco: Never Used  Substance and Sexual Activity  . Alcohol use: No    Alcohol/week: 0.0 standard drinks  . Drug use: No  . Sexual activity: Yes     Partners: Male    Birth control/protection: Other-see comments    Comment: Hysterectomy  Lifestyle  . Physical activity:    Days per week: 2 days    Minutes per session: 20 min  . Stress: Not at all  Relationships  . Social connections:    Talks on phone: More than three times a week    Gets together: Never    Attends religious service: Never    Active member of club or organization: No    Attends meetings of clubs or organizations: Never    Relationship status: Married  . Intimate partner violence:    Fear of current or ex partner: No    Emotionally abused: No    Physically abused: No    Forced sexual activity: No  Other Topics Concern  . Not on file  Social History Narrative   Married, she has a grown daughter that has autism.    She lost her job at [employer] on 10/22/2015 ( worker there for 10 years), after she used all her FMLA for medical problems. She was fired the day after her short term disability ran out and she was getting ready to have carpal tunnel repair and right thumb replacement.      Current Outpatient Medications:  .  amitriptyline (ELAVIL) 25 MG tablet, Take by mouth., Disp: , Rfl:  .  amphetamine-dextroamphetamine (ADDERALL) 10 MG tablet, Take 1-20 tablets by mouth 2 (two) times daily., Disp: , Rfl:  .  aspirin 81 MG tablet, Take by mouth., Disp: , Rfl:  .  betamethasone dipropionate (DIPROLENE) 0.05 % cream, BETAMETHASONE DIPROPIONATE, 0.05% (External Cream) - Historical Medication  apply twice a day as needed (0.05 %) Active Comments: Medication taken as needed. , Disp: , Rfl:  .  Biotin (BIOTIN 5000) 5 MG CAPS, Take by mouth., Disp: , Rfl:  .  cyclobenzaprine (FLEXERIL) 5 MG tablet, Take 1 tablet by mouth at bedtime., Disp: , Rfl: 0 .  cycloSPORINE (RESTASIS) 0.05 % ophthalmic emulsion, 1 drop 2 (two) times daily., Disp: , Rfl:  .  diclofenac sodium (VOLTAREN) 1 % GEL, Apply 4 g topically daily., Disp: , Rfl:  .  DULoxetine (CYMBALTA) 60 MG capsule, Take 2  capsules (120 mg total) by mouth daily., Disp: 180 capsule, Rfl: 1 .  estradiol (ESTRACE) 1 MG tablet, Take 1 tablet (1 mg total) by mouth daily., Disp: 90 tablet, Rfl: 1 .  folic acid (FOLVITE) 1 MG tablet, Take 1 tablet by mouth daily., Disp: , Rfl:  .  furosemide (LASIX) 40 MG tablet, Take 1 tablet (40 mg total) by mouth daily., Disp: 90 tablet, Rfl: 1 .  hydroxychloroquine (PLAQUENIL) 200 MG tablet, Take by  mouth., Disp: , Rfl:  .  lidocaine (XYLOCAINE) 2 % solution, Use as directed 20 mLs in the mouth or throat as needed for mouth pain., Disp: 200 mL, Rfl: 1 .  metFORMIN (GLUCOPHAGE XR) 750 MG 24 hr tablet, Take 1 tablet (750 mg total) by mouth daily with breakfast., Disp: 90 tablet, Rfl: 1 .  methotrexate 50 MG/2ML injection, Inject 0.8 mLs into the skin once a week., Disp: , Rfl:  .  Multiple Vitamins-Minerals (MULTI VITAMIN/MINERALS) TABS, Take by mouth., Disp: , Rfl:  .  NARCAN 4 MG/0.1ML LIQD nasal spray kit, , Disp: , Rfl:  .  Oxycodone HCl 10 MG TABS, Take 1 tablet by mouth 5 (five) times daily., Disp: , Rfl:  .  pilocarpine (SALAGEN) 5 MG tablet, Take 1 tablet by mouth 3 (three) times daily. For dry mouth, Disp: , Rfl:  .  pimecrolimus (ELIDEL) 1 % cream, Apply topically 2 (two) times daily., Disp: 100 g, Rfl: 0 .  potassium chloride SA (KLOR-CON M20) 20 MEQ tablet, Take 1 tablet (20 mEq total) by mouth 2 (two) times daily., Disp: 180 tablet, Rfl: 0 .  pravastatin (PRAVACHOL) 40 MG tablet, TAKE 1 TABLET EVERY EVENING FOR CHOLESTEROL, Disp: 90 tablet, Rfl: 1 .  ranitidine (ZANTAC) 150 MG tablet, Take 1 tablet (150 mg total) by mouth 2 (two) times daily., Disp: 180 tablet, Rfl: 1 .  ULORIC 40 MG tablet, , Disp: , Rfl:  .  Vitamin D, Ergocalciferol, (DRISDOL) 50000 units CAPS capsule, , Disp: , Rfl:   Allergies  Allergen Reactions  . Tomato Hives  . Allopurinol     Patient-reported  . Gabapentin Other (See Comments) and Swelling  . Meperidine   . Tramadol      ROS  Ten  systems reviewed and is negative except as mentioned in HPI   Objective  Vitals:   10/31/17 1444  BP: 118/76  Pulse: (!) 101  Resp: 14  Temp: 98.3 F (36.8 C)  TempSrc: Oral  SpO2: 95%  Weight: 229 lb 3.2 oz (104 kg)  Height: '5\' 3"'  (1.6 m)    Body mass index is 40.6 kg/m.  Physical Exam  Constitutional: Patient appears well-developed and well-nourished. Obese  No distress.  HEENT: head atraumatic, normocephalic, pupils equal and reactive to light,neck supple, throat within normal limits Cardiovascular: Normal rate, regular rhythm and normal heart sounds.  No murmur heard. No BLE edema. Pulmonary/Chest: Effort normal and breath sounds normal. No respiratory distress. Abdominal: Soft.  There is no tenderness. Psychiatric: Patient has a normal mood and affect. behavior is normal. Judgment and thought content normal.  PHQ2/9: Depression screen [facility] 2/9 10/31/2017 10/31/2017 08/22/2017 05/22/2017 01/26/2017  Decreased Interest 0 0 0 0 0  Down, Depressed, Hopeless 0 0 0 0 0  PHQ - 2 Score 0 0 0 0 0  Altered sleeping 0 - 3 2 -  Tired, decreased energy 3 - 3 3 -  Change in appetite 3 - 3 0 -  Feeling bad or failure about yourself  3 - 1 0 -  Trouble concentrating 1 - 1 1 -  Moving slowly or fidgety/restless 1 - 1 0 -  Suicidal thoughts 1 - 0 0 -  PHQ-9 Score 12 - 12 6 -  Difficult doing work/chores Very difficult - Very difficult Somewhat difficult -     Fall Risk: Fall Risk  10/31/2017 08/22/2017 05/22/2017 04/10/2017 01/26/2017  Falls in the past year? No No No No No  Number falls in past yr: - - - - -  Injury with Fall? - - - - -  Risk Factor Category  - - - - -  Follow up - - - - -      Assessment & Plan  1. SOB (shortness of breath)  Chronic, normal spirometry, discussed deconditioning, importance of increasing physical activity and offered to refer her to cardiologist, but she would like to hold off for now  Spirometry - normal   2. Needs flu shot  Flu shot  today

## 2017-11-01 ENCOUNTER — Other Ambulatory Visit: Payer: Self-pay | Admitting: Family Medicine

## 2017-11-01 ENCOUNTER — Encounter: Payer: Self-pay | Admitting: Family Medicine

## 2017-11-01 DIAGNOSIS — Z1231 Encounter for screening mammogram for malignant neoplasm of breast: Secondary | ICD-10-CM

## 2017-11-08 ENCOUNTER — Encounter: Payer: Self-pay | Admitting: Family Medicine

## 2017-11-08 ENCOUNTER — Ambulatory Visit
Admission: RE | Admit: 2017-11-08 | Discharge: 2017-11-08 | Disposition: A | Discharge status: Home / Self Care | Source: Ambulatory Visit | Attending: Family Medicine | Admitting: Family Medicine

## 2017-11-08 DIAGNOSIS — Z1231 Encounter for screening mammogram for malignant neoplasm of breast: Secondary | ICD-10-CM | POA: Insufficient documentation

## 2017-12-25 ENCOUNTER — Other Ambulatory Visit: Payer: Self-pay | Admitting: Family Medicine

## 2017-12-25 DIAGNOSIS — K219 Gastro-esophageal reflux disease without esophagitis: Secondary | ICD-10-CM

## 2018-02-11 ENCOUNTER — Other Ambulatory Visit: Payer: Self-pay | Admitting: Family Medicine

## 2018-02-11 ENCOUNTER — Telehealth: Payer: Self-pay | Admitting: Family Medicine

## 2018-02-11 DIAGNOSIS — R600 Localized edema: Secondary | ICD-10-CM

## 2018-02-11 NOTE — Telephone Encounter (Signed)
Copied from CRM [id number]. Topic: Quick Communication - Rx Refill/Question >> Feb 11, 2018 10:04 AM Stephannie LiSimmons, Ankur Snowdon L, NT wrote: Medication: potassium chloride SA (KLOR-CON M20) 20 MEQ tablet   Has the patient contacted their pharmacy? yes  (Agent: If no, request that the patient contact the pharmacy for the refill. (Agent: If yes, when and what did the pharmacy advise? Pharmacy says they have contact the practice and not received a response  Preferred Pharmacy (with phone number or street name  [facility] [place] [address] [phone] (Phone) [phone] (Fax    Agent: Please be advised that RX refills may take up to 3 business days. We ask that you follow-up with your pharmacy.

## 2018-02-11 NOTE — Telephone Encounter (Signed)
Refill request was sent to Dr. Krichna Sowles for approval and submission.  

## 2018-02-11 NOTE — Telephone Encounter (Signed)
Please advise 

## 2018-02-11 NOTE — Telephone Encounter (Signed)
Copied from CRM [id number]. Topic: Quick Communication - See Telephone Encounter >> Feb 11, 2018 10:08 AM Stephannie LiSimmons, Kaidyn Javid L, NT wrote: CRM for notification. See Telephone encounter for: 02/11/18. Patient says she has received a letter from [facility] saying they have pulled   ranitidine (ZANTAC) 150 MG tablet , due to the manufacturer , and she would like to know if there is another mediation that is similar . She would also like a 30 day supply [id number] [place]- [place], [place] - [address] [facility] [address] [phone] (Phone) [phone]) And the remainder [facility]  [facility] [place] [address] [phone] (Phone) [phone] (Fax)

## 2018-02-13 ENCOUNTER — Other Ambulatory Visit: Payer: Self-pay | Admitting: Family Medicine

## 2018-02-13 MED ORDER — FAMOTIDINE 20 MG PO TABS: 20.0000 mg | ORAL_TABLET | Freq: Two times a day (BID) | ORAL | 0 refills | Status: DC

## 2018-02-19 HISTORY — PX: LUMBAR LAMINECTOMY: SHX95

## 2018-02-20 ENCOUNTER — Ambulatory Visit: Admitting: Family Medicine

## 2018-02-22 ENCOUNTER — Ambulatory Visit: Admitting: Family Medicine

## 2018-02-25 ENCOUNTER — Other Ambulatory Visit: Payer: Self-pay | Admitting: Family Medicine

## 2018-02-25 DIAGNOSIS — R600 Localized edema: Secondary | ICD-10-CM

## 2018-02-25 NOTE — Telephone Encounter (Signed)
Refill request for general medication: Potassium Chloride  Last office visit: 10/31/2017  Last physical exam: 01/26/2017  Follow-ups on file. None indicated

## 2018-03-08 ENCOUNTER — Other Ambulatory Visit: Payer: Self-pay | Admitting: Family Medicine

## 2018-03-08 DIAGNOSIS — F331 Major depressive disorder, recurrent, moderate: Secondary | ICD-10-CM

## 2018-03-08 NOTE — Telephone Encounter (Signed)
Refill request for general medication: Cymbalta 60 mg  Last office visit: 10/31/17  Last physical exam: 01/26/17  Follow-ups on file. None indicated

## 2018-04-01 ENCOUNTER — Telehealth: Payer: Self-pay | Admitting: Family Medicine

## 2018-04-01 NOTE — Telephone Encounter (Signed)
Copied from CRM [id number]. Topic: Quick Communication - See Telephone Encounter >> Apr 01, 2018  8:48 AM Burchel, Abbi R wrote: CRM for notification. See Telephone encounter for: 04/01/18.  Dr Jackelyn Knife requesting a call back from Dr Carlynn Purl re: pt's disability paperwork/claim.  Dr Jackelyn Knife states that the matter is urgent and that he needs to speak with Dr Carlynn Purl as soon as she is available as he has to report his findings early on Wednesday morning.  Please call: [phone]

## 2018-04-02 NOTE — Telephone Encounter (Signed)
LMOM stating that hopefully we can talk to him in the morning

## 2018-04-03 NOTE — Telephone Encounter (Unsigned)
Copied from CRM [id number]. Topic: General - Other >> Apr 03, 2018  2:26 PM Jilda Roche wrote: Reason for CRM: Patient is scheduled and on the wait list, her appt is on 05/14/18, please advise if she needs to be worked in sooner  Best call back is [phone]

## 2018-04-03 NOTE — Telephone Encounter (Signed)
Dr. Jackelyn KnifeSudaka called me and stated not enough information on objective findings, he will have to close the case but may be re-opened. I recommend a visit with PT for functional capacity and also a follow up with me to gather more documentation

## 2018-04-03 NOTE — Telephone Encounter (Signed)
Dr. Carlynn Purl explained the message to the patient on the phone today at 2:20 p.m.

## 2018-04-03 NOTE — Telephone Encounter (Signed)
Please call the pt, exlain Dr Carlynn Purl message and when pt is ready to schedule with Dr Carlynn Purl please let me know. Pt may need to do PT first before seeing Dr Carlynn Purl. Please advise

## 2018-05-14 ENCOUNTER — Ambulatory Visit (INDEPENDENT_AMBULATORY_CARE_PROVIDER_SITE_OTHER): Admitting: Family Medicine

## 2018-05-14 ENCOUNTER — Encounter: Payer: Self-pay | Admitting: Family Medicine

## 2018-05-14 ENCOUNTER — Other Ambulatory Visit: Payer: Self-pay | Admitting: Family Medicine

## 2018-05-14 VITALS — BP 138/94 | HR 108 | Temp 98.1°F | Resp 18 | Ht 63.0 in | Wt 224.9 lb

## 2018-05-14 DIAGNOSIS — R7303 Prediabetes: Secondary | ICD-10-CM

## 2018-05-14 DIAGNOSIS — M3503 Sicca syndrome with myopathy: Secondary | ICD-10-CM | POA: Diagnosis not present

## 2018-05-14 DIAGNOSIS — E785 Hyperlipidemia, unspecified: Secondary | ICD-10-CM

## 2018-05-14 DIAGNOSIS — R5382 Chronic fatigue, unspecified: Secondary | ICD-10-CM

## 2018-05-14 DIAGNOSIS — G894 Chronic pain syndrome: Secondary | ICD-10-CM

## 2018-05-14 DIAGNOSIS — R05 Cough: Secondary | ICD-10-CM

## 2018-05-14 DIAGNOSIS — D8989 Other specified disorders involving the immune mechanism, not elsewhere classified: Secondary | ICD-10-CM

## 2018-05-14 DIAGNOSIS — G9332 Myalgic encephalomyelitis/chronic fatigue syndrome: Secondary | ICD-10-CM

## 2018-05-14 DIAGNOSIS — E8940 Asymptomatic postprocedural ovarian failure: Secondary | ICD-10-CM

## 2018-05-14 DIAGNOSIS — R059 Cough, unspecified: Secondary | ICD-10-CM

## 2018-05-14 DIAGNOSIS — E538 Deficiency of other specified B group vitamins: Secondary | ICD-10-CM

## 2018-05-14 DIAGNOSIS — R600 Localized edema: Secondary | ICD-10-CM

## 2018-05-14 DIAGNOSIS — M359 Systemic involvement of connective tissue, unspecified: Secondary | ICD-10-CM

## 2018-05-14 DIAGNOSIS — M797 Fibromyalgia: Secondary | ICD-10-CM

## 2018-05-14 DIAGNOSIS — F331 Major depressive disorder, recurrent, moderate: Secondary | ICD-10-CM | POA: Diagnosis not present

## 2018-05-14 DIAGNOSIS — E559 Vitamin D deficiency, unspecified: Secondary | ICD-10-CM

## 2018-05-14 DIAGNOSIS — K219 Gastro-esophageal reflux disease without esophagitis: Secondary | ICD-10-CM

## 2018-05-14 LAB — LIPID PANEL
CHOL/HDL RATIO: 3.4 ratio (ref 0.0–4.4)
Cholesterol, Total: 166 mg/dL (ref 100–199)
HDL: 49 mg/dL (ref >39)
LDL CALC: 78 mg/dL (ref 0–99)
Triglycerides: 193 mg/dL — ABNORMAL HIGH (ref 0–149)
VLDL Cholesterol Cal: 39 mg/dL (ref 5–40)

## 2018-05-14 LAB — CBC WITH DIFFERENTIAL/PLATELET
BASOS ABS: 0.1 10*3/uL (ref 0.0–0.2)
Basos: 1 %
EOS (ABSOLUTE): 0.2 10*3/uL (ref 0.0–0.4)
Eos: 3 %
HEMATOCRIT: 41.4 % (ref 34.0–46.6)
HEMOGLOBIN: 13.9 g/dL (ref 11.1–15.9)
Immature Grans (Abs): 0 10*3/uL (ref 0.0–0.1)
Immature Granulocytes: 1 %
LYMPHS ABS: 2.2 10*3/uL (ref 0.7–3.1)
Lymphs: 44 %
MCH: 29.1 pg (ref 26.6–33.0)
MCHC: 33.6 g/dL (ref 31.5–35.7)
MCV: 87 fL (ref 79–97)
MONOCYTES: 9 %
Monocytes Absolute: 0.4 10*3/uL (ref 0.1–0.9)
NEUTROS ABS: 2.1 10*3/uL (ref 1.4–7.0)
Neutrophils: 42 %
Platelets: 291 10*3/uL (ref 150–450)
RBC: 4.77 x10E6/uL (ref 3.77–5.28)
RDW: 15.6 % — ABNORMAL HIGH (ref 11.7–15.4)
WBC: 5 10*3/uL (ref 3.4–10.8)

## 2018-05-14 LAB — HEMOGLOBIN A1C
Est. average glucose Bld gHb Est-mCnc: 128 mg/dL
HEMOGLOBIN A1C: 6.1 % — AB (ref 4.8–5.6)

## 2018-05-14 LAB — COMPREHENSIVE METABOLIC PANEL
ALBUMIN: 4.3 g/dL (ref 3.8–4.8)
ALT: 40 IU/L — ABNORMAL HIGH (ref 0–32)
AST: 32 IU/L (ref 0–40)
Albumin/Globulin Ratio: 1.8 (ref 1.2–2.2)
Alkaline Phosphatase: 94 IU/L (ref 39–117)
BUN / CREAT RATIO: 8 — AB (ref 9–23)
BUN: 6 mg/dL (ref 6–24)
Bilirubin Total: 0.2 mg/dL (ref 0.0–1.2)
CO2: 23 mmol/L (ref 20–29)
CREATININE: 0.76 mg/dL (ref 0.57–1.00)
Calcium: 9.6 mg/dL (ref 8.7–10.2)
Chloride: 100 mmol/L (ref 96–106)
GFR, EST AFRICAN AMERICAN: 108 mL/min/{1.73_m2} (ref >59)
GFR, EST NON AFRICAN AMERICAN: 94 mL/min/{1.73_m2} (ref >59)
Globulin, Total: 2.4 g/dL (ref 1.5–4.5)
Glucose: 110 mg/dL — ABNORMAL HIGH (ref 65–99)
Potassium: 4.3 mmol/L (ref 3.5–5.2)
Sodium: 138 mmol/L (ref 134–144)
TOTAL PROTEIN: 6.7 g/dL (ref 6.0–8.5)

## 2018-05-14 LAB — VITAMIN B12: VITAMIN B 12: 351 pg/mL (ref 232–1245)

## 2018-05-14 LAB — VITAMIN D 25 HYDROXY (VIT D DEFICIENCY, FRACTURES): Vit D, 25-Hydroxy: 32.8 ng/mL (ref 30.0–100.0)

## 2018-05-14 MED ORDER — BENZONATATE 100 MG PO CAPS: 100.0000 mg | ORAL_CAPSULE | Freq: Two times a day (BID) | ORAL | 0 refills | Status: DC | PRN

## 2018-05-14 MED ORDER — PRAVASTATIN SODIUM 40 MG PO TABS: ORAL_TABLET | ORAL | 1 refills | Status: DC

## 2018-05-14 MED ORDER — DULOXETINE HCL 60 MG PO CPEP: 120.0000 mg | ORAL_CAPSULE | Freq: Every day | ORAL | 1 refills | Status: DC

## 2018-05-14 MED ORDER — UMECLIDINIUM-VILANTEROL 62.5-25 MCG/INH IN AEPB: 1.0000 | INHALATION_SPRAY | Freq: Every day | RESPIRATORY_TRACT | 0 refills | Status: DC

## 2018-05-14 MED ORDER — FAMOTIDINE 20 MG PO TABS: 20.0000 mg | ORAL_TABLET | Freq: Two times a day (BID) | ORAL | 1 refills | Status: DC

## 2018-05-14 MED ORDER — RANITIDINE HCL 150 MG PO CAPS: 150.0000 mg | ORAL_CAPSULE | Freq: Two times a day (BID) | ORAL | 0 refills | Status: DC

## 2018-05-14 MED ORDER — FLUOXETINE HCL 20 MG PO CAPS: 20.0000 mg | ORAL_CAPSULE | Freq: Every day | ORAL | 1 refills | Status: DC

## 2018-05-14 MED ORDER — ESTRADIOL 0.5 MG PO TABS: 0.5000 mg | ORAL_TABLET | Freq: Every day | ORAL | 1 refills | Status: DC

## 2018-05-14 MED ORDER — B-12 1000 MCG SL SUBL: 1.0000 | SUBLINGUAL_TABLET | SUBLINGUAL | 1 refills | Status: AC

## 2018-05-14 MED ORDER — DULAGLUTIDE 1.5 MG/0.5ML ~~LOC~~ SOAJ: 1.5000 mg | SUBCUTANEOUS | 1 refills | Status: DC

## 2018-05-14 NOTE — Progress Notes (Signed)
Name: Tracy [name]   MRN: [id number]    DOB: [birth year]   Date:05/14/2018       Progress Note  Subjective  Chief Complaint  Chief Complaint  Patient presents with  . Medication Refill  . Depression  . Sjogren's syndrome  . Fibromyalgia  . Dyslipidemia    HPI  Sjogrens Sees Rheumatologist- [facility]. [facility] was, still has myalgias, dry mouth, dry eyes and daily fatigue- She gets Adderal from Dr. Alroy Dust and it helps with fatigue. Currently on Methotrexate and plaquenil for symptoms still has daily pain and is discouraged   Failed back syndrome and chronic pain  She had decompression lumbar spine Dec 2019, symptoms improved for a couple of weeks but got gradually worse again, back to square one now and is not candidate for injections at this time. She still seeing pain clinic. She has intermittent radiculitis on right lower leg. No bowel or bladder incontinence.  Obesity Insurancewont cover any weight loss medications. Patient states she has tried metformin but was having uncontrollable diarrhea, but since GLP1 not covered she would like to try metformin ER and monitor. Diet: states tries to follow autoimmune diet (avoiding organ meats, nightshade vegetables) cutting down pastas, trying to walk more often   FMS/Connective Tissue Disorder she is always in pain, [facility],she also has recurrent cervical lymphadenopathy and periods of sore throat, fever.   Pre-diabetes  she denies polyphagia, no polydipsia, but sips on water because of Sjogren's, she has polyuria from lasix. She could not tolerate Metformin, she states it increased her appetite and caused weight gain. .Last hgbA1C was 6.1%. Discussed GLP-1 agonist, no family history of thyroid cancer or personal history of pancreatitis.   Dyslipidemia GERD she is taking Pravastatin, still has daily muscles aches from FMS. LDL is at goal, discussed ways to  bring triglycerides down  Major Depression  Cough Symptoms are worse , she states not approved for disability, in pain all the time and worse since failed back surgery in Dec. Feels bad because she cannot help out at home. She does have a long history of depression, since her 76's. She has taken alprazolam for anxiety in the past , tried Wellbutrin and Prozac. She was switched to duloxetine because of neuropathy. She would like to go back on prozac. She refuses psychiatric referral. She denies suicidal thoughts or ideation.   Surgical menopause She is wiling to try lower dose today  GERD:  She continues to have heart burn, she is off ranitidine, and is on pepcid symptoms not as controlled, she would like to go back on Ranitidine  She denies dysphagia  Cough:  She has a history of smoking, quit in 2018, states since an URI infection about 2 months ago she has a daily cough, worse in am and during the night, sputum is pale in color, no blood in sputum and no sob   Patient Active Problem List   Diagnosis Date Noted  . Moderate episode of recurrent major depressive disorder ([place]) 05/22/2017  . Elevated uric acid in blood 04/28/2016  . Degenerative disc disease, lumbar 05/26/2015  . Lumbar herniated disc 05/26/2015  . Connective tissue disorder ([place]) 12/09/2014  . 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 Fibromyalgia 12/09/2014  . Abnormal ECG 12/08/2014  . Abnormal antinuclear antibody titer 12/08/2014  . Major depression, chronic 12/08/2014  . Edema leg 12/08/2014  . 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 CFIDS (chronic fatigue and immune  dysfunction syndrome) ([place]) 12/08/2014  . 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 Dyslipidemia 12/08/2014  . Family history of breast cancer 12/08/2014  . H/O: hysterectomy 12/08/2014  . Dysmetabolic syndrome 09/06/9483  . Obesity (BMI 30-39.9) 12/08/2014  . Tobacco abuse 12/08/2014  . Disorder of tendon 12/08/2014  . Sjogren's syndrome ([place]) 12/08/2014  .  Allergic rhinitis, seasonal 12/08/2014  . Snores 12/08/2014  . Tenosynovitis of thumb 12/02/2014    Past Surgical History:  Procedure Laterality Date  . ABDOMINAL HYSTERECTOMY    . BREAST REDUCTION SURGERY Bilateral 2000   [facility]  . CARPAL TUNNEL RELEASE Right 12/16/2015  . CARPAL TUNNEL RELEASE Left 04/18/2016   also left thumb replacement  . CHOLECYSTECTOMY    . ENDOMETRIAL ABLATION     ovarian cyst removal  . JOINT REPLACEMENT Right 12/16/2015   right thumb  . NASAL SINUS SURGERY    . REDUCTION MAMMAPLASTY  2000   september  . TUBAL LIGATION      Family History  Problem Relation Age of Onset  . Hypertension Mother   . Allergic rhinitis Mother   . Hypertension Father   . Alcohol abuse Father   . Heart disease Father   . Alcohol abuse Brother   . Hypertension Brother   . Breast cancer Maternal Aunt 30       two aunts  . Breast cancer Paternal Aunt   . Breast cancer Maternal Grandmother   . Breast cancer Cousin 21       paternal cousin    Social History   Socioeconomic History  . Marital status: Married    Spouse name: Glendell Docker  . Number of children: 1  . Years of education: Not on file  . Highest education level: Some college, no degree  Occupational History  . Occupation: disabled    Comment: used to work at [employer]  . Financial resource strain: Not hard at all  . Food insecurity:    Worry: Never true    Inability: Never true  . Transportation needs:    Medical: No    Non-medical: No  Tobacco Use  . Smoking status: Former Smoker    Packs/day: 0.75    Years: 28.00    Pack years: 21.00    Types: Cigarettes    Start date: 12/08/1988    Last attempt to quit: 12/11/2016    Years since quitting: 1.4  . Smokeless tobacco: Never Used  Substance and Sexual Activity  . Alcohol use: No    Alcohol/week: 0.0 standard drinks  . Drug use: No  . Sexual activity: Yes    Partners: Male    Birth control/protection: Other-see comments    Comment:  Hysterectomy  Lifestyle  . Physical activity:    Days per week: 2 days    Minutes per session: 20 min  . Stress: Not at all  Relationships  . Social connections:    Talks on phone: More than three times a week    Gets together: Never    Attends religious service: Never    Active member of club or organization: No    Attends meetings of clubs or organizations: Never    Relationship status: Married  . Intimate partner violence:    Fear of current or ex partner: No    Emotionally abused: No    Physically abused: No    Forced sexual activity: No  Other Topics Concern  . Not on file  Social History Narrative   Married, she has a grown  daughter that has autism.    She lost her job at [employer] on 10/22/2015 ( worker there for 10 years), after she used all her FMLA for medical problems. She was fired the day after her short term disability ran out and she was getting ready to have carpal tunnel repair and right thumb replacement.      Current Outpatient Medications:  .  amitriptyline (ELAVIL) 25 MG tablet, Take by mouth., Disp: , Rfl:  .  amphetamine-dextroamphetamine (ADDERALL) 10 MG tablet, Take 1-20 tablets by mouth 2 (two) times daily., Disp: , Rfl:  .  aspirin 81 MG tablet, Take by mouth., Disp: , Rfl:  .  betamethasone dipropionate (DIPROLENE) 0.05 % cream, BETAMETHASONE DIPROPIONATE, 0.05% (External Cream) - Historical Medication  apply twice a day as needed (0.05 %) Active Comments: Medication taken as needed. , Disp: , Rfl:  .  Biotin (BIOTIN 5000) 5 MG CAPS, Take by mouth., Disp: , Rfl:  .  cyclobenzaprine (FLEXERIL) 5 MG tablet, Take 1 tablet by mouth at bedtime., Disp: , Rfl: 0 .  cycloSPORINE (RESTASIS) 0.05 % ophthalmic emulsion, 1 drop 2 (two) times daily., Disp: , Rfl:  .  diclofenac sodium (VOLTAREN) 1 % GEL, Apply 4 g topically daily., Disp: , Rfl:  .  DULoxetine (CYMBALTA) 60 MG capsule, TAKE 2 CAPSULES DAILY, Disp: 180 capsule, Rfl: 0 .  estradiol (ESTRACE) 1 MG  tablet, Take 1 tablet (1 mg total) by mouth daily., Disp: 90 tablet, Rfl: 1 .  famotidine (PEPCID) 20 MG tablet, Take 1 tablet (20 mg total) by mouth 2 (two) times daily., Disp: 180 tablet, Rfl: 0 .  folic acid (FOLVITE) 1 MG tablet, Take 2 tablets by mouth daily. , Disp: , Rfl:  .  furosemide (LASIX) 40 MG tablet, Take 1 tablet (40 mg total) by mouth daily., Disp: 90 tablet, Rfl: 1 .  hydroxychloroquine (PLAQUENIL) 200 MG tablet, Take by mouth., Disp: , Rfl:  .  lidocaine (XYLOCAINE) 2 % solution, Use as directed 20 mLs in the mouth or throat as needed for mouth pain., Disp: 200 mL, Rfl: 1 .  methotrexate 50 MG/2ML injection, Inject 0.8 mLs into the skin once a week., Disp: , Rfl:  .  Multiple Vitamins-Minerals (MULTI VITAMIN/MINERALS) TABS, Take by mouth., Disp: , Rfl:  .  NARCAN 4 MG/0.1ML LIQD nasal spray kit, , Disp: , Rfl:  .  Oxycodone HCl 10 MG TABS, Take 1 tablet by mouth 5 (five) times daily., Disp: , Rfl:  .  pilocarpine (SALAGEN) 5 MG tablet, Take 1 tablet by mouth 3 (three) times daily. For dry mouth, Disp: , Rfl:  .  pimecrolimus (ELIDEL) 1 % cream, Apply topically 2 (two) times daily., Disp: 100 g, Rfl: 0 .  potassium chloride SA (K-DUR,KLOR-CON) 20 MEQ tablet, TAKE 1 TABLET TWICE A DAY, Disp: 180 tablet, Rfl: 1 .  pravastatin (PRAVACHOL) 40 MG tablet, TAKE 1 TABLET EVERY EVENING FOR CHOLESTEROL, Disp: 90 tablet, Rfl: 1 .  ULORIC 40 MG tablet, , Disp: , Rfl:  .  Vitamin D, Ergocalciferol, (DRISDOL) 50000 units CAPS capsule, , Disp: , Rfl:   Allergies  Allergen Reactions  . Tomato Hives  . Allopurinol     Patient-reported  . Gabapentin Other (See Comments) and Swelling  . Meperidine   . Tramadol     I personally reviewed active problem list, medication list, allergies, family history, social history with the patient/caregiver today.   ROS  Constitutional: Negative for fever, positive for  weight change.  Respiratory:  positive  for cough but no  shortness of breath.    Cardiovascular: Negative for chest pain or palpitations.  Gastrointestinal: Negative for abdominal pain, no bowel changes.  Musculoskeletal: Positive  for gait problem and intermittent  joint swelling.  Skin: Negative for rash.  Neurological: Negative for dizziness or headache.  No other specific complaints in a complete review of systems (except as listed in HPI above).  Objective  Vitals:   05/14/18 1342  BP: (!) 138/94  Pulse: (!) 108  Resp: 18  Temp: 98.1 F (36.7 C)  TempSrc: Oral  SpO2: 97%  Weight: 224 lb 14.4 oz (102 kg)  Height: '5\' 3"'  (1.6 m)    Body mass index is 39.84 kg/m.  Physical Exam  Constitutional: Patient appears well-developed and well-nourished. Obese  No distress.  HEENT: head atraumatic, normocephalic, pupils equal and reactive to light,  neck supple, throat within normal limits Cardiovascular: Normal rate, regular rhythm and normal heart sounds.  No murmur heard. No BLE edema. Pulmonary/Chest: Effort normal and breath sounds normal. No respiratory distress. Muscular Skeletal: using a cane, decrease rom of right shoulder, up to 90 degrees, positive trigger points throughout, negative straight leg raise, pain during palpation of lumbar spine Abdominal: Soft.  There is no tenderness. Psychiatric: Patient has a depressed mood, crying. States secondary to pain, behavior is normal. Judgment and thought content normal.  Recent Results (from the past 2160 hour(s))  Comprehensive metabolic panel     Status: Abnormal   Collection Time: 05/13/18 10:42 AM  Result Value Ref Range   Glucose 110 (H) 65 - 99 mg/dL   BUN 6 6 - 24 mg/dL   Creatinine, Ser 0.76 0.57 - 1.00 mg/dL   GFR calc non Af Amer 94 >59 mL/min/1.73   GFR calc Af Amer 108 >59 mL/min/1.73   BUN/Creatinine Ratio 8 (L) 9 - 23   Sodium 138 134 - 144 mmol/L   Potassium 4.3 3.5 - 5.2 mmol/L   Chloride 100 96 - 106 mmol/L   CO2 23 20 - 29 mmol/L   Calcium 9.6 8.7 - 10.2 mg/dL   Total Protein 6.7  6.0 - 8.5 g/dL   Albumin 4.3 3.8 - 4.8 g/dL   Globulin, Total 2.4 1.5 - 4.5 g/dL   Albumin/Globulin Ratio 1.8 1.2 - 2.2   Bilirubin Total 0.2 0.0 - 1.2 mg/dL   Alkaline Phosphatase 94 39 - 117 IU/L   AST 32 0 - 40 IU/L   ALT 40 (H) 0 - 32 IU/L  Hemoglobin A1c     Status: Abnormal   Collection Time: 05/13/18 10:42 AM  Result Value Ref Range   Hgb A1c MFr Bld 6.1 (H) 4.8 - 5.6 %    Comment:          Prediabetes: 5.7 - 6.4          Diabetes: >6.4          Glycemic control for adults with diabetes: <7.0    Est. average glucose Bld gHb Est-mCnc 128 mg/dL  Lipid panel     Status: Abnormal   Collection Time: 05/13/18 10:42 AM  Result Value Ref Range   Cholesterol, Total 166 100 - 199 mg/dL   Triglycerides 193 (H) 0 - 149 mg/dL   HDL 49 >39 mg/dL   VLDL Cholesterol Cal 39 5 - 40 mg/dL   LDL Calculated 78 0 - 99 mg/dL   Chol/HDL Ratio 3.4 0.0 - 4.4 ratio    Comment:  T. Chol/HDL Ratio                                             Men  Women                               1/2 Avg.Risk  3.4    3.3                                   Avg.Risk  5.0    4.4                                2X Avg.Risk  9.6    7.1                                3X Avg.Risk 23.4   11.0   CBC with Differential/Platelet     Status: Abnormal   Collection Time: 05/13/18 10:42 AM  Result Value Ref Range   WBC 5.0 3.4 - 10.8 x10E3/uL   RBC 4.77 3.77 - 5.28 x10E6/uL   Hemoglobin 13.9 11.1 - 15.9 g/dL   Hematocrit 41.4 34.0 - 46.6 %   MCV 87 79 - 97 fL   MCH 29.1 26.6 - 33.0 pg   MCHC 33.6 31.5 - 35.7 g/dL   RDW 15.6 (H) 11.7 - 15.4 %   Platelets 291 150 - 450 x10E3/uL   Neutrophils 42 Not Estab. %   Lymphs 44 Not Estab. %   Monocytes 9 Not Estab. %   Eos 3 Not Estab. %   Basos 1 Not Estab. %   Neutrophils Absolute 2.1 1.4 - 7.0 x10E3/uL   Lymphocytes Absolute 2.2 0.7 - 3.1 x10E3/uL   Monocytes Absolute 0.4 0.1 - 0.9 x10E3/uL   EOS (ABSOLUTE) 0.2 0.0 - 0.4 x10E3/uL   Basophils  Absolute 0.1 0.0 - 0.2 x10E3/uL   Immature Granulocytes 1 Not Estab. %   Immature Grans (Abs) 0.0 0.0 - 0.1 x10E3/uL  Vitamin B12     Status: None   Collection Time: 05/13/18 10:42 AM  Result Value Ref Range   Vitamin B-12 351 232 - 1,245 pg/mL  VITAMIN D 25 Hydroxy (Vit-D Deficiency, Fractures)     Status: None   Collection Time: 05/13/18 10:42 AM  Result Value Ref Range   Vit D, 25-Hydroxy 32.8 30.0 - 100.0 ng/mL    Comment: Vitamin D deficiency has been defined by the Institute of Medicine and an Endocrine Society practice guideline as a level of serum 25-OH vitamin D less than 20 ng/mL (1,2). The Endocrine Society went on to further define vitamin D insufficiency as a level between 21 and 29 ng/mL (2). 1. IOM (Institute of Medicine). 2010. Dietary reference    intakes for calcium and D. [place]: The    [employer]. 2. Holick MF, Binkley [place], Bischoff-Ferrari HA, et al.    Evaluation, treatment, and prevention of vitamin D    deficiency: an Endocrine Society clinical practice    guideline. JCEM. 2011 Jul; 96(7):1911-30.      PHQ2/9: Depression screen [facility] 2/9 05/14/2018 10/31/2017 10/31/2017 08/22/2017 05/22/2017  Decreased Interest 0  0 0 0 0  Down, Depressed, Hopeless 3 0 0 0 0  PHQ - 2 Score 3 0 0 0 0  Altered sleeping 3 0 - 3 2  Tired, decreased energy 3 3 - 3 3  Change in appetite 1 3 - 3 0  Feeling bad or failure about yourself  3 3 - 1 0  Trouble concentrating 2 1 - 1 1  Moving slowly or fidgety/restless 0 1 - 1 0  Suicidal thoughts 0 1 - 0 0  PHQ-9 Score 15 12 - 12 6  Difficult doing work/chores Extremely dIfficult Very difficult - Very difficult Somewhat difficult   Phq 9 positive, she refuses seeing psychiatrist, she states her pain is related to chronic pain, inability to work and states therapy has not helped her in the past.   Fall Risk: Fall Risk  05/14/2018 10/31/2017 08/22/2017 05/22/2017 04/10/2017  Falls in the past year? 1 No No No No  Number  falls in past yr: 0 - - - -  Injury with Fall? 0 - - - -  Risk Factor Category  - - - - -  Follow up Falls evaluation completed - - - -      Functional Status Survey: Is the patient deaf or have difficulty hearing?: Yes(right ear slight hearing lose) Does the patient have difficulty seeing, even when wearing glasses/contacts?: Yes Does the patient have difficulty concentrating, remembering, or making decisions?: No Does the patient have difficulty walking or climbing stairs?: Yes Does the patient have difficulty dressing or bathing?: No Does the patient have difficulty doing errands alone such as visiting a doctor's office or shopping?: No    Assessment & Plan  1. Moderate episode of recurrent major depressive disorder ([place])  Refuses follow up with psychiatrist.  Stop Duloxetine, discussed how to overlap with prozac for one week before stopping medicaiton, patient states she did much better on prozac in the past.   2. Dyslipidemia  - pravastatin (PRAVACHOL) 40 MG tablet; TAKE 1 TABLET EVERY EVENING FOR CHOLESTEROL  Dispense: 90 tablet; Refill: 1  3. Bilateral lower extremity edema  Stable   4. Surgical menopause  We will try going down on the dose today  - estradiol (ESTRACE) 0.5 MG tablet; Take 1 tablet (0.5 mg total) by mouth daily.  Dispense: 90 tablet; Refill: 1  5. Sjogren's syndrome with myopathy ([place])  Causing a lot of symptoms   6. CFIDS (chronic fatigue and immune dysfunction syndrome) ([facility])   7. Connective tissue disorder ([place])  Under the care of Rheumatologist   8. Vitamin D deficiency  Continue vitamin D supplements   9. B12 deficiency  - Cyanocobalamin (B-12) 1000 MCG SUBL; Place 1 tablet under the tongue 3 (three) times a week.  Dispense: 48 each; Refill: 1  10. GERD without esophagitis  Stop famotidine and go back on Ranitidine  11. Prediabetes  - Dulaglutide (TRULICITY) 1.5 JK/0.9FG SOPN; Inject 1.5 mg into the skin once a week.   Dispense: 12 pen; Refill: 1  12. Cough  History of tobacco use. We will try Anoro, she wants to hold off on testing for now  - umeclidinium-vilanterol (ANORO ELLIPTA) 62.5-25 MCG/INH AEPB; Inhale 1 puff into the lungs daily.  Dispense: 60 each; Refill: 0  13. Fibromyalgia  She has chronic pain   14. Chronic pain syndrome  - Ambulatory referral to Physical Therapy

## 2018-05-28 ENCOUNTER — Other Ambulatory Visit: Payer: Self-pay | Admitting: Family Medicine

## 2018-05-29 ENCOUNTER — Telehealth: Payer: Self-pay | Admitting: Family Medicine

## 2018-05-29 ENCOUNTER — Other Ambulatory Visit: Payer: Self-pay

## 2018-05-29 ENCOUNTER — Encounter: Payer: Self-pay | Admitting: Family Medicine

## 2018-05-29 DIAGNOSIS — R05 Cough: Secondary | ICD-10-CM

## 2018-05-29 DIAGNOSIS — R059 Cough, unspecified: Secondary | ICD-10-CM

## 2018-05-29 NOTE — Telephone Encounter (Signed)
Refill request for general medication. Ranitidine, Fluoxetine, and Anoro to [facility] with 90 day supplies   Last office visit 05/14/2018   Follow up on 07/31/2018

## 2018-05-29 NOTE — Telephone Encounter (Signed)
Copied from CRM [id number]. Topic: General - Other >> May 29, 2018  5:01 PM Tracy [name] wrote: Reason for CRM: Patient called to request a new referral for [facility], since she cannot come on 05/31/18.  Patient would like the referral for the beginning of May if at all possible.  Please advise and call patient back at [phone]

## 2018-05-30 MED ORDER — UMECLIDINIUM-VILANTEROL 62.5-25 MCG/INH IN AEPB: 1.0000 | INHALATION_SPRAY | Freq: Every day | RESPIRATORY_TRACT | 0 refills | Status: DC

## 2018-05-30 MED ORDER — RANITIDINE HCL 150 MG PO CAPS: 150.0000 mg | ORAL_CAPSULE | Freq: Two times a day (BID) | ORAL | 0 refills | Status: DC

## 2018-05-30 MED ORDER — FLUOXETINE HCL 20 MG PO CAPS: 20.0000 mg | ORAL_CAPSULE | Freq: Every day | ORAL | 0 refills | Status: DC

## 2018-05-30 NOTE — Telephone Encounter (Signed)
Ranitidine needs to go [facility]- The other two Fluoxetine and Anoro need to go to [facility].

## 2018-06-14 ENCOUNTER — Encounter: Admitting: Family Medicine

## 2018-06-24 ENCOUNTER — Encounter: Payer: Self-pay | Admitting: Family Medicine

## 2018-07-28 LAB — HEPATIC FUNCTION PANEL
ALT: 24 (ref 7–35)
AST: 22 (ref 13–35)
Alkaline Phosphatase: 114 (ref 25–125)

## 2018-07-28 LAB — CBC AND DIFFERENTIAL
HCT: 42 (ref 36–46)
Hemoglobin: 14.3 (ref 12.0–16.0)
WBC: 6.8

## 2018-07-28 LAB — TSH: TSH: 3.96 (ref 0.41–5.90)

## 2018-07-28 LAB — BASIC METABOLIC PANEL
BUN: 10 (ref 4–21)
Creatinine: 0.9 (ref 0.5–1.1)
Glucose: 87

## 2018-07-28 LAB — POCT ERYTHROCYTE SEDIMENTATION RATE, NON-AUTOMATED: Sed Rate: 8

## 2018-07-31 ENCOUNTER — Encounter: Payer: Self-pay | Admitting: Family Medicine

## 2018-07-31 ENCOUNTER — Ambulatory Visit (INDEPENDENT_AMBULATORY_CARE_PROVIDER_SITE_OTHER): Admitting: Family Medicine

## 2018-07-31 ENCOUNTER — Other Ambulatory Visit: Payer: Self-pay

## 2018-07-31 VITALS — BP 118/64 | HR 94 | Temp 97.6°F | Resp 16 | Ht 62.75 in | Wt 200.9 lb

## 2018-07-31 DIAGNOSIS — R112 Nausea with vomiting, unspecified: Secondary | ICD-10-CM

## 2018-07-31 DIAGNOSIS — R634 Abnormal weight loss: Secondary | ICD-10-CM

## 2018-07-31 DIAGNOSIS — Z01419 Encounter for gynecological examination (general) (routine) without abnormal findings: Secondary | ICD-10-CM | POA: Diagnosis not present

## 2018-07-31 DIAGNOSIS — R1012 Left upper quadrant pain: Secondary | ICD-10-CM | POA: Diagnosis not present

## 2018-07-31 MED ORDER — ONDANSETRON HCL 4 MG PO TABS: 4.0000 mg | ORAL_TABLET | Freq: Three times a day (TID) | ORAL | 0 refills | Status: DC | PRN

## 2018-07-31 NOTE — Progress Notes (Signed)
Name: Tracy [name]   MRN: [id number]    DOB: December 01, 1970   Date:07/31/2018       Progress Note  Subjective  Chief Complaint  Chief Complaint  Patient presents with  . Annual Exam    With Trulicity she has had diarrhea, stomach pain, nausea- can't keep foods down and vomiting    HPI   Patient presents for annual CPE and discussed weight loss and abdominal pan  Left upper quadrant pain: she was seen in March and was started on Trulicity for pre-diabetes and weight loss, she developed nausea and vomiting and lost weight quickly in the first few weeks, she tried smaller portions but symptoms did not resolved, she stopped taking medications weeks ago but over the past two weeks she noticed left upper quadrant pain that is getting worse , tender to touch and with meals. She denies dysuria or hematuria but has a history of kidney stones also. Pain is described as constant but worse with movement and palpation. Pain is described as sharp. No change in bowel movements. She had labs done Friday, ordered by Rheumatologist and unable to see results.   Diet: she is eating bland food because of nausea and vomiting  Exercise: currently not doing anything, had joined a gym and going to the pool, however since COVID-19 unable to go back   USPSTF grade A and B recommendations    Office Visit from 07/31/2018 in [facility]  AUDIT-C Score  1     Depression: Phq 9 is  positive Depression screen [facility] 2/9 07/31/2018 05/14/2018 10/31/2017 10/31/2017 08/22/2017  Decreased Interest 1 0 0 0 0  Down, Depressed, Hopeless 1 3 0 0 0  PHQ - 2 Score 2 3 0 0 0  Altered sleeping 1 3 0 - 3  Tired, decreased energy _0 - 3  Change in appetite _1 - 3  Feeling bad or failure about yourself  0 3 3 - 1  Trouble concentrating 0 2 1 - 1  Moving slowly or fidgety/restless 0 0 1 - 1  Suicidal thoughts 0 0 1 - 0  PHQ-9 Score _2 - 12  Difficult doing work/chores Somewhat difficult Extremely  dIfficult Very difficult - Very difficult  Some recent data might be hidden   Hypertension: BP Readings from Last 3 Encounters:  07/31/18 118/64  05/14/18 (!) 138/94  10/31/17 118/76   Obesity: Wt Readings from Last 3 Encounters:  07/31/18 200 lb 14.4 oz (91.1 kg)  05/14/18 224 lb 14.4 oz (102 kg)  10/31/17 229 lb 3.2 oz (104 kg)   BMI Readings from Last 3 Encounters:  07/31/18 35.87 kg/m  05/14/18 39.84 kg/m  10/31/17 40.60 kg/m    Hep C Screening: today  STD testing and prevention (HIV/chl/gon/syphilis): N/A Intimate partner violence:negative screen  Sexual History/Pain during Intercourse: no pain during intercourse  Menstrual History/LMP/Abnormal Bleeding: s/p hysterectomy  Incontinence Symptoms: she has some stress incontinence intermittent   Advanced Care Planning: A voluntary discussion about advance care planning including the explanation and discussion of advance directives.  Discussed health care proxy and Living will, and the patient was able to identify a health care proxy as husband  Patient does not have a living will at present time.   Breast cancer:  HM Mammogram  Date Value Ref Range Status  07/18/2013 Normal  Final    BRCA gene screening: she had a negative BRCA test  Cervical cancer screening: s/p hysterectomy for benign causes  Osteoporosis Screening: discussed importance of physical activity and high calcium and vitamin D diet   Lipids:  Lab Results  Component Value Date   CHOL 166 05/13/2018   CHOL 176 01/30/2017   CHOL 215 (H) 02/28/2016   Lab Results  Component Value Date   HDL 49 05/13/2018   HDL 69 01/30/2017   HDL 53 02/28/2016   Lab Results  Component Value Date   LDLCALC 78 05/13/2018   LDLCALC 84 01/30/2017   LDLCALC 122 (H) 02/28/2016   Lab Results  Component Value Date   TRIG 193 (H) 05/13/2018   TRIG 116 01/30/2017   TRIG 202 (H) 02/28/2016   Lab Results  Component Value Date   CHOLHDL 3.4 05/13/2018   CHOLHDL 2.6  01/30/2017   CHOLHDL 4.1 02/28/2016   No results found for: LDLDIRECT  Glucose:  Glucose  Date Value Ref Range Status  05/13/2018 110 (H) 65 - 99 mg/dL Final  04/04/2017 142 (H) 65 - 99 mg/dL Final  12/09/2014 96 65 - 99 mg/dL Final  09/16/2013 89 65 - 99 mg/dL Final    Skin cancer: discussed atypical lesions  Colorectal cancer: 2016 Lung cancer:   Low Dose CT Chest recommended if Age 25-80 years, 30 pack-year currently smoking OR have quit w/in 15years. Patient does not qualify.   [id number]  Patient Active Problem List   Diagnosis Date Noted  . Prediabetes 05/14/2018  . Moderate episode of recurrent major depressive disorder ([place]) 05/22/2017  . Elevated uric acid in blood 04/28/2016  . Degenerative disc disease, lumbar 05/26/2015  . Lumbar herniated disc 05/26/2015  . Connective tissue disorder ([place]) 12/09/2014  . 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 Fibromyalgia 12/09/2014  . Abnormal ECG 12/08/2014  . Abnormal antinuclear antibody titer 12/08/2014  . Major depression, chronic 12/08/2014  . Edema leg 12/08/2014  . 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 Dyslipidemia 12/08/2014  . Family history of breast cancer 12/08/2014  . H/O: hysterectomy 12/08/2014  . Dysmetabolic syndrome 29/92/4268  . Obesity (BMI 30-39.9) 12/08/2014  . Disorder of tendon 12/08/2014  . Sjogren's syndrome ([place]) 12/08/2014  . Allergic rhinitis, seasonal 12/08/2014  . Snores 12/08/2014  . Tenosynovitis of thumb 12/02/2014    Past Surgical History:  Procedure Laterality Date  . ABDOMINAL HYSTERECTOMY    . BREAST REDUCTION SURGERY Bilateral 2000   [facility]  . CARPAL TUNNEL RELEASE Right 12/16/2015  . CARPAL TUNNEL RELEASE Left 04/18/2016   also left thumb replacement  . CHOLECYSTECTOMY    . ENDOMETRIAL ABLATION     ovarian cyst removal  . JOINT REPLACEMENT Right 12/16/2015   right  thumb  . LUMBAR LAMINECTOMY  02/19/2018  . NASAL SINUS SURGERY    . REDUCTION MAMMAPLASTY  2000   september  . TUBAL LIGATION      Family History  Problem Relation Age of Onset  . Hypertension Mother   . Allergic rhinitis Mother   . Hypertension Father   . Alcohol abuse Father   . Heart disease Father   . Alcohol abuse Brother   . Hypertension Brother   . Breast cancer Maternal Aunt 30       two aunts  . Breast cancer Paternal Aunt   . Breast cancer Maternal Grandmother   . Breast cancer Cousin 79       paternal cousin    Social History   Socioeconomic History  . Marital status: Married  Spouse name: Glendell Docker  . Number of children: 1  . Years of education: Not on file  . Highest education level: Some college, no degree  Occupational History  . Occupation: disabled    Comment: used to work at [employer]  . Financial resource strain: Not hard at all  . Food insecurity:    Worry: Never true    Inability: Never true  . Transportation needs:    Medical: No    Non-medical: No  Tobacco Use  . Smoking status: Former Smoker    Packs/day: 0.75    Years: 28.00    Pack years: 21.00    Types: Cigarettes    Start date: 12/08/1988    Last attempt to quit: 12/11/2016    Years since quitting: 1.6  . Smokeless tobacco: Never Used  Substance and Sexual Activity  . Alcohol use: No    Alcohol/week: 0.0 standard drinks  . Drug use: No  . Sexual activity: Yes    Partners: Male    Birth control/protection: Other-see comments    Comment: Hysterectomy  Lifestyle  . Physical activity:    Days per week: 2 days    Minutes per session: 20 min  . Stress: Not at all  Relationships  . Social connections:    Talks on phone: More than three times a week    Gets together: Never    Attends religious service: Never    Active member of club or organization: No    Attends meetings of clubs or organizations: Never    Relationship status: Married  . Intimate partner violence:     Fear of current or ex partner: No    Emotionally abused: No    Physically abused: No    Forced sexual activity: No  Other Topics Concern  . Not on file  Social History Narrative   Married, she has a grown daughter that has autism.    She lost her job at [employer] on 10/22/2015 ( worker there for 10 years), after she used all her FMLA for medical problems. She was fired the day after her short term disability ran out and she was getting ready to have carpal tunnel repair and right thumb replacement.      Current Outpatient Medications:  .  amitriptyline (ELAVIL) 25 MG tablet, Take 1 tablet by mouth every evening., Disp: , Rfl:  .  amphetamine-dextroamphetamine (ADDERALL) 10 MG tablet, Take 1-20 tablets by mouth 2 (two) times daily., Disp: , Rfl:  .  aspirin 81 MG tablet, Take by mouth., Disp: , Rfl:  .  betamethasone dipropionate (DIPROLENE) 0.05 % cream, Apply 1 g topically daily as needed., Disp: , Rfl:  .  Biotin (BIOTIN 5000) 5 MG CAPS, Take by mouth., Disp: , Rfl:  .  Cyanocobalamin (B-12) 1000 MCG SUBL, Place 1 tablet under the tongue 3 (three) times a week., Disp: 48 each, Rfl: 1 .  cycloSPORINE (RESTASIS) 0.05 % ophthalmic emulsion, Place 1 drop into both eyes 2 (two) times daily., Disp: , Rfl:  .  estradiol (ESTRACE) 0.5 MG tablet, Take 1 tablet (0.5 mg total) by mouth daily., Disp: 90 tablet, Rfl: 1 .  FLUoxetine (PROZAC) 20 MG capsule, Take 1 capsule (20 mg total) by mouth daily., Disp: 90 capsule, Rfl: 0 .  folic acid (FOLVITE) 1 MG tablet, Take 2 tablets by mouth daily., Disp: , Rfl:  .  furosemide (LASIX) 40 MG tablet, Take 1 tablet (40 mg total) by mouth daily., Disp: 90 tablet, Rfl:  1 .  hydroxychloroquine (PLAQUENIL) 200 MG tablet, Take 1 tablet by mouth 2 (two) times daily., Disp: , Rfl:  .  methocarbamol (ROBAXIN) 750 MG tablet, , Disp: , Rfl:  .  methotrexate 50 MG/2ML injection, Inject 0.8 mLs into the skin once a week., Disp: , Rfl:  .  Multiple Vitamins-Minerals  (MULTI VITAMIN/MINERALS) TABS, Take by mouth., Disp: , Rfl:  .  NARCAN 4 MG/0.1ML LIQD nasal spray kit, , Disp: , Rfl:  .  Oxycodone HCl 10 MG TABS, Take 1 tablet by mouth 5 (five) times daily., Disp: , Rfl:  .  pilocarpine (SALAGEN) 5 MG tablet, Take 1 tablet by mouth 3 (three) times daily. For dry mouth, Disp: , Rfl:  .  potassium chloride SA (K-DUR,KLOR-CON) 20 MEQ tablet, TAKE 1 TABLET TWICE A DAY, Disp: 180 tablet, Rfl: 1 .  pravastatin (PRAVACHOL) 40 MG tablet, TAKE 1 TABLET EVERY EVENING FOR CHOLESTEROL, Disp: 90 tablet, Rfl: 1 .  ULORIC 40 MG tablet, Take 1 tablet by mouth daily., Disp: , Rfl:  .  Vitamin D, Ergocalciferol, (DRISDOL) 50000 units CAPS capsule, Take 1 capsule by mouth once a week., Disp: , Rfl:  .  cyclobenzaprine (FLEXERIL) 5 MG tablet, Take 1 tablet by mouth at bedtime., Disp: , Rfl: 0  Allergies  Allergen Reactions  . Tomato Hives  . Allopurinol     Patient-reported  . Gabapentin Other (See Comments) and Swelling  . Meperidine   . Tramadol      ROS  Constitutional: Negative for fever, positive for  weight change.  Respiratory: Negative for cough and shortness of breath.   Cardiovascular: Negative for chest pain or palpitations.  Gastrointestinal: positive  for abdominal pain, but no  bowel changes.  Musculoskeletal: positive  for gait problem and intermittent  joint swelling.  Skin: Negative for rash.  Neurological: Negative for dizziness but has headache.  No other specific complaints in a complete review of systems (except as listed in HPI above).  Objective  Vitals:   07/31/18 1038  BP: 118/64  Pulse: 94  Resp: 16  Temp: 97.6 F (36.4 C)  TempSrc: Oral  SpO2: 98%  Weight: 200 lb 14.4 oz (91.1 kg)  Height: 5' 2.75" (1.594 m)    Body mass index is 35.87 kg/m.  Physical Exam  Constitutional: Patient appears well-developed and well-nourished. Obese  No distress.  HENT: Head: Normocephalic and atraumatic. Ears: B TMs ok, no erythema or  effusion; Nose: Nose normal. Mouth/Throat: Oropharynx is clear and moist. No oropharyngeal exudate.  Eyes: Conjunctivae and EOM are normal. Pupils are equal, round, and reactive to light. No scleral icterus.  Neck: Normal range of motion. Neck supple. No JVD present. No thyromegaly present.  Cardiovascular: Normal rate, regular rhythm and normal heart sounds.  No murmur heard. No BLE edema. Pulmonary/Chest: Effort normal and breath sounds normal. No respiratory distress. Abdominal: Soft. Bowel sounds are normal, no distension. There is no tenderness during palpation of left side of abdomen, no epigastric tenderness, no guarding or rebound tenderness, mild positive CVA tenderness on left side  Breast: no lumps or masses, no nipple discharge or rashes FEMALE GENITALIA:  External genitalia normal External urethra normal Vaginal vault normal without discharge or lesions Cervix normal without discharge or lesions Bimanual exam normal without masses RECTAL: not done Musculoskeletal: Normal range of motion, no joint effusions. No gross deformities Neurological: he is alert and oriented to person, place, and time. No cranial nerve deficit. Coordination, balance, strength, speech and gait are normal.  Skin: Skin is warm and dry. No rash noted. No erythema.  Psychiatric: Patient has a normal mood and affect. behavior is normal. Judgment and thought content normal.  Recent Results (from the past 2160 hour(s))  Comprehensive metabolic panel     Status: Abnormal   Collection Time: 05/13/18 10:42 AM  Result Value Ref Range   Glucose 110 (H) 65 - 99 mg/dL   BUN 6 6 - 24 mg/dL   Creatinine, Ser 0.76 0.57 - 1.00 mg/dL   GFR calc non Af Amer 94 >59 mL/min/1.73   GFR calc Af Amer 108 >59 mL/min/1.73   BUN/Creatinine Ratio 8 (L) 9 - 23   Sodium 138 134 - 144 mmol/L   Potassium 4.3 3.5 - 5.2 mmol/L   Chloride 100 96 - 106 mmol/L   CO2 23 20 - 29 mmol/L   Calcium 9.6 8.7 - 10.2 mg/dL   Total Protein 6.7  6.0 - 8.5 g/dL   Albumin 4.3 3.8 - 4.8 g/dL   Globulin, Total 2.4 1.5 - 4.5 g/dL   Albumin/Globulin Ratio 1.8 1.2 - 2.2   Bilirubin Total 0.2 0.0 - 1.2 mg/dL   Alkaline Phosphatase 94 39 - 117 IU/L   AST 32 0 - 40 IU/L   ALT 40 (H) 0 - 32 IU/L  Hemoglobin A1c     Status: Abnormal   Collection Time: 05/13/18 10:42 AM  Result Value Ref Range   Hgb A1c MFr Bld 6.1 (H) 4.8 - 5.6 %    Comment:          Prediabetes: 5.7 - 6.4          Diabetes: >6.4          Glycemic control for adults with diabetes: <7.0    Est. average glucose Bld gHb Est-mCnc 128 mg/dL  Lipid panel     Status: Abnormal   Collection Time: 05/13/18 10:42 AM  Result Value Ref Range   Cholesterol, Total 166 100 - 199 mg/dL   Triglycerides 193 (H) 0 - 149 mg/dL   HDL 49 >39 mg/dL   VLDL Cholesterol Cal 39 5 - 40 mg/dL   LDL Calculated 78 0 - 99 mg/dL   Chol/HDL Ratio 3.4 0.0 - 4.4 ratio    Comment:                                   T. Chol/HDL Ratio                                             Men  Women                               1/2 Avg.Risk  3.4    3.3                                   Avg.Risk  5.0    4.4                                2X Avg.Risk  9.6    7.1  3X Avg.Risk 23.4   11.0   CBC with Differential/Platelet     Status: Abnormal   Collection Time: 05/13/18 10:42 AM  Result Value Ref Range   WBC 5.0 3.4 - 10.8 x10E3/uL   RBC 4.77 3.77 - 5.28 x10E6/uL   Hemoglobin 13.9 11.1 - 15.9 g/dL   Hematocrit 41.4 34.0 - 46.6 %   MCV 87 79 - 97 fL   MCH 29.1 26.6 - 33.0 pg   MCHC 33.6 31.5 - 35.7 g/dL   RDW 15.6 (H) 11.7 - 15.4 %   Platelets 291 150 - 450 x10E3/uL   Neutrophils 42 Not Estab. %   Lymphs 44 Not Estab. %   Monocytes 9 Not Estab. %   Eos 3 Not Estab. %   Basos 1 Not Estab. %   Neutrophils Absolute 2.1 1.4 - 7.0 x10E3/uL   Lymphocytes Absolute 2.2 0.7 - 3.1 x10E3/uL   Monocytes Absolute 0.4 0.1 - 0.9 x10E3/uL   EOS (ABSOLUTE) 0.2 0.0 - 0.4 x10E3/uL   Basophils  Absolute 0.1 0.0 - 0.2 x10E3/uL   Immature Granulocytes 1 Not Estab. %   Immature Grans (Abs) 0.0 0.0 - 0.1 x10E3/uL  Vitamin B12     Status: None   Collection Time: 05/13/18 10:42 AM  Result Value Ref Range   Vitamin B-12 351 232 - 1,245 pg/mL  VITAMIN D 25 Hydroxy (Vit-D Deficiency, Fractures)     Status: None   Collection Time: 05/13/18 10:42 AM  Result Value Ref Range   Vit D, 25-Hydroxy 32.8 30.0 - 100.0 ng/mL    Comment: Vitamin D deficiency has been defined by the Institute of Medicine and an Endocrine Society practice guideline as a level of serum 25-OH vitamin D less than 20 ng/mL (1,2). The Endocrine Society went on to further define vitamin D insufficiency as a level between 21 and 29 ng/mL (2). 1. IOM (Institute of Medicine). 2010. Dietary reference    intakes for calcium and D. [place]: The    [employer]. 2. Holick MF, Binkley [place], Bischoff-Ferrari HA, et al.    Evaluation, treatment, and prevention of vitamin D    deficiency: an Endocrine Society clinical practice    guideline. JCEM. 2011 Jul; 96(7):1911-30.   CBC and differential     Status: None   Collection Time: 07/28/18 12:00 AM  Result Value Ref Range   Hemoglobin 14.3 12.0 - 16.0   HCT 42 36 - 46   WBC 6.8   POCT erythrocyte sed rate, Non-automated     Status: None   Collection Time: 07/28/18 12:00 AM  Result Value Ref Range   Sed Rate 8   Basic metabolic panel     Status: None   Collection Time: 07/28/18 12:00 AM  Result Value Ref Range   Glucose 87    BUN 10 4 - 21   Creatinine 0.9 0.5 - 1.1  Hepatic function panel     Status: None   Collection Time: 07/28/18 12:00 AM  Result Value Ref Range   Alkaline Phosphatase 114 25 - 125   ALT 24 7 - 35   AST 22 13 - 35  TSH     Status: None   Collection Time: 07/28/18 12:00 AM  Result Value Ref Range   TSH 3.96 0.41 - 5.90      PHQ2/9: Depression screen [facility] 2/9 07/31/2018 05/14/2018 10/31/2017 10/31/2017 08/22/2017  Decreased Interest  1 0 0 0 0  Down, Depressed, Hopeless 1 3 0 0 0  PHQ - 2 Score 2 3 0 0 0  Altered sleeping 1 3 0 - 3  Tired, decreased energy _0 - 3  Change in appetite _1 - 3  Feeling bad or failure about yourself  0 3 3 - 1  Trouble concentrating 0 2 1 - 1  Moving slowly or fidgety/restless 0 0 1 - 1  Suicidal thoughts 0 0 1 - 0  PHQ-9 Score _2 - 12  Difficult doing work/chores Somewhat difficult Extremely dIfficult Very difficult - Very difficult  Some recent data might be hidden     Fall Risk: Fall Risk  07/31/2018 05/14/2018 10/31/2017 08/22/2017 05/22/2017  Falls in the past year? 1 1 No No No  Number falls in past yr: 1 0 - - -  Injury with Fall? 0 0 - - -  Risk Factor Category  - - - - -  Risk for fall due to : Orthopedic patient - - - -  Risk for fall due to: Comment Joint Pain - - - -  Follow up - Falls evaluation completed - - -     Functional Status Survey: Is the patient deaf or have difficulty hearing?: No Does the patient have difficulty seeing, even when wearing glasses/contacts?: Yes(Reading glasses) Does the patient have difficulty concentrating, remembering, or making decisions?: Yes(Sometimes) Does the patient have difficulty walking or climbing stairs?: Yes(Climbing steps are difficult) Does the patient have difficulty dressing or bathing?: No Does the patient have difficulty doing errands alone such as visiting a doctor's office or shopping?: Yes   Assessment & Plan  1. Well woman exam   2. Left upper quadrant pain  - Comprehensive metabolic panel - CBC with Differential/Platelet - Lipase - Hepatitis panel, acute  If lipase is elevated we will check CT pancreas, if negative CT renal stone  3. Non-intractable vomiting with nausea, unspecified vomiting type  - Comprehensive metabolic panel - CBC with Differential/Platelet - Lipase - Hepatitis panel, acute  4. Weight loss  - Comprehensive metabolic panel - CBC with Differential/Platelet - Lipase -  Hepatitis panel, acute   -USPSTF grade A and B recommendations reviewed with patient; age-appropriate recommendations, preventive care, screening tests, etc discussed and encouraged; healthy living encouraged; see AVS for patient education given to patient -Discussed importance of 150 minutes of physical activity weekly, eat two servings of fish weekly, eat one serving of tree nuts ( cashews, pistachios, pecans, almonds.Marland Kitchen) every other day, eat 6 servings of fruit/vegetables daily and drink plenty of water and avoid sweet beverages.

## 2018-08-01 ENCOUNTER — Other Ambulatory Visit: Payer: Self-pay | Admitting: Family Medicine

## 2018-08-01 ENCOUNTER — Ambulatory Visit

## 2018-08-01 DIAGNOSIS — R1012 Left upper quadrant pain: Secondary | ICD-10-CM

## 2018-08-01 DIAGNOSIS — R109 Unspecified abdominal pain: Secondary | ICD-10-CM

## 2018-08-01 LAB — CBC WITH DIFFERENTIAL/PLATELET
Basophils Absolute: 0 10*3/uL (ref 0.0–0.2)
Basos: 1 %
EOS (ABSOLUTE): 0.2 10*3/uL (ref 0.0–0.4)
Eos: 2 %
Hematocrit: 40.5 % (ref 34.0–46.6)
Hemoglobin: 13.9 g/dL (ref 11.1–15.9)
Immature Grans (Abs): 0 10*3/uL (ref 0.0–0.1)
Immature Granulocytes: 0 %
Lymphocytes Absolute: 2.1 10*3/uL (ref 0.7–3.1)
Lymphs: 27 %
MCH: 29.9 pg (ref 26.6–33.0)
MCHC: 34.3 g/dL (ref 31.5–35.7)
MCV: 87 fL (ref 79–97)
Monocytes Absolute: 0.7 10*3/uL (ref 0.1–0.9)
Monocytes: 9 %
Neutrophils Absolute: 4.9 10*3/uL (ref 1.4–7.0)
Neutrophils: 61 %
Platelets: 288 10*3/uL (ref 150–450)
RBC: 4.65 x10E6/uL (ref 3.77–5.28)
RDW: 14.3 % (ref 11.7–15.4)
WBC: 7.9 10*3/uL (ref 3.4–10.8)

## 2018-08-01 LAB — COMPREHENSIVE METABOLIC PANEL
ALT: 14 IU/L (ref 0–32)
AST: 15 IU/L (ref 0–40)
Albumin/Globulin Ratio: 1.8 (ref 1.2–2.2)
Albumin: 4.3 g/dL (ref 3.8–4.8)
Alkaline Phosphatase: 105 IU/L (ref 39–117)
BUN/Creatinine Ratio: 9 (ref 9–23)
BUN: 11 mg/dL (ref 6–24)
Bilirubin Total: <0.2 mg/dL (ref 0.0–1.2)
CO2: 24 mmol/L (ref 20–29)
Calcium: 9.4 mg/dL (ref 8.7–10.2)
Chloride: 97 mmol/L (ref 96–106)
Creatinine, Ser: 1.17 mg/dL — ABNORMAL HIGH (ref 0.57–1.00)
GFR calc Af Amer: 64 mL/min/{1.73_m2} (ref >59)
GFR calc non Af Amer: 55 mL/min/{1.73_m2} — ABNORMAL LOW (ref >59)
Globulin, Total: 2.4 g/dL (ref 1.5–4.5)
Glucose: 100 mg/dL — ABNORMAL HIGH (ref 65–99)
Potassium: 3.6 mmol/L (ref 3.5–5.2)
Sodium: 139 mmol/L (ref 134–144)
Total Protein: 6.7 g/dL (ref 6.0–8.5)

## 2018-08-01 LAB — LIPASE: Lipase: 19 U/L (ref 14–72)

## 2018-08-01 LAB — HEPATITIS PANEL, ACUTE
Hep A IgM: NEGATIVE
Hep B C IgM: NEGATIVE
Hep C Virus Ab: <0.1 s/co ratio (ref 0.0–0.9)
Hepatitis B Surface Ag: NEGATIVE

## 2018-08-14 ENCOUNTER — Other Ambulatory Visit: Payer: Self-pay | Admitting: Family Medicine

## 2018-08-14 NOTE — Telephone Encounter (Signed)
Refill request for general medication. Prozac to [facility]    Last office visit 07/31/2018   Follow up on 11/15/2018

## 2018-08-24 ENCOUNTER — Other Ambulatory Visit: Payer: Self-pay | Admitting: Family Medicine

## 2018-08-24 DIAGNOSIS — R600 Localized edema: Secondary | ICD-10-CM

## 2018-09-26 ENCOUNTER — Other Ambulatory Visit: Payer: Self-pay | Admitting: Family Medicine

## 2018-09-26 DIAGNOSIS — E8940 Asymptomatic postprocedural ovarian failure: Secondary | ICD-10-CM

## 2018-09-26 DIAGNOSIS — E785 Hyperlipidemia, unspecified: Secondary | ICD-10-CM

## 2018-10-08 ENCOUNTER — Other Ambulatory Visit: Payer: Self-pay

## 2018-10-08 ENCOUNTER — Ambulatory Visit (INDEPENDENT_AMBULATORY_CARE_PROVIDER_SITE_OTHER): Admitting: Family Medicine

## 2018-10-08 ENCOUNTER — Encounter: Payer: Self-pay | Admitting: Family Medicine

## 2018-10-08 DIAGNOSIS — F331 Major depressive disorder, recurrent, moderate: Secondary | ICD-10-CM

## 2018-10-08 MED ORDER — FLUOXETINE HCL 40 MG PO CAPS: 40.0000 mg | ORAL_CAPSULE | Freq: Every day | ORAL | 0 refills | Status: DC

## 2018-10-08 NOTE — Patient Instructions (Signed)
Two medications you are on, when combined, may, in rare cases, cause something called serotonin syndrome.   If any of the following symptoms occur, please stop your antidepressant medication right away and present to the emergency department: Increased temperature, flushed skin and increased sweating, tremor, muscle stiffness, very dry mouth, or uncrontrolled fidgeting (needing to cross/uncross your legs constantly, needing to walk in place constantly, etc.) 

## 2018-10-08 NOTE — Progress Notes (Signed)
Name: Tracy [name]   MRN: [id number]    DOB: Aug 09, 1970   Date:10/08/2018       Progress Note  Subjective  Chief Complaint  Chief Complaint  Patient presents with  . Follow-up    discuss medication  . Depression    increase Prozac    I connected with  Dewayne Hatch  on 10/08/18 at  9:40 AM EDT by a video enabled telemedicine application and verified that I am speaking with the correct person using two identifiers.  I discussed the limitations of evaluation and management by telemedicine and the availability of in person appointments. The patient expressed understanding and agreed to proceed. Staff also discussed with the patient that there may be a patient responsible charge related to this service. Patient Location: Home Provider Location: Office Additional Individuals present: None  HPI  Major Depression: Symptoms are worse , she states not approved for disability, in pain all the time and worse since failed back surgery in Dec 2019; she has significant chronic pain and chronic health issues that cause her a lot of stress and depression.  Feels bad because she cannot help out at home. She does have a long history of depression, since her 63's. She has taken alprazolam for anxiety in the past , tried Wellbutrin (had vivid dreams) and Prozac. She was switched to duloxetine because of neuropathy. She was put back on prozac 32m by Dr. SAncil Boozerin March and felt that there was a difference at first, but she would like to increase her dose today for better efficacy. She refuses psychiatric referral. She denies suicidal thoughts or ideation; no panic attacks.  She has considered counseling, but   Patient Active Problem List   Diagnosis Date Noted  . Prediabetes 05/14/2018  . Moderate episode of recurrent major depressive disorder ([place] 05/22/2017  . Elevated uric acid in blood 04/28/2016  . Degenerative disc disease, lumbar 05/26/2015  . Lumbar herniated disc 05/26/2015  .  Connective tissue disorder ([place] 12/09/2014  . 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 Fibromyalgia 12/09/2014  . Abnormal ECG 12/08/2014  . Abnormal antinuclear antibody titer 12/08/2014  . Major depression, chronic 12/08/2014  . Edema leg 12/08/2014  . 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 CFIDS (chronic fatigue and immune dysfunction syndrome) ([place] 12/08/2014  . 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 Dyslipidemia 12/08/2014  . Family history of breast cancer 12/08/2014  . H/O: hysterectomy 12/08/2014  . Dysmetabolic syndrome 045/36/4680 . Obesity (BMI 30-39.9) 12/08/2014  . Disorder of tendon 12/08/2014  . Sjogren's syndrome ([place] 12/08/2014  . Allergic rhinitis, seasonal 12/08/2014  . Snores 12/08/2014  . Tenosynovitis of thumb 12/02/2014    Past Surgical History:  Procedure Laterality Date  . ABDOMINAL HYSTERECTOMY    . BREAST REDUCTION SURGERY Bilateral 2000   [facility]  . CARPAL TUNNEL RELEASE Right 12/16/2015  . CARPAL TUNNEL RELEASE Left 04/18/2016   also left thumb replacement  . CHOLECYSTECTOMY    . ENDOMETRIAL ABLATION     ovarian cyst removal  . JOINT REPLACEMENT Right 12/16/2015   right thumb  . LUMBAR LAMINECTOMY  02/19/2018  . NASAL SINUS SURGERY    . REDUCTION MAMMAPLASTY  2000   september  . TUBAL LIGATION      Family History  Problem Relation Age of Onset  . Hypertension Mother   . Allergic rhinitis Mother   . Hypertension Father   . Alcohol abuse Father   . Heart disease Father   . Alcohol abuse Brother   .  Hypertension Brother   . Breast cancer Maternal Aunt 30       two aunts  . Breast cancer Paternal Aunt   . Breast cancer Maternal Grandmother   . Breast cancer Cousin 55       paternal cousin    Social History   Socioeconomic History  . Marital status: Married    Spouse name: Glendell Docker  . Number of children: 1  . Years of education: Not on file  . Highest education level: Some college, no degree  Occupational History  . Occupation: disabled     Comment: used to work at [employer]  . Financial resource strain: Not hard at all  . Food insecurity    Worry: Never true    Inability: Never true  . Transportation needs    Medical: No    Non-medical: No  Tobacco Use  . Smoking status: Former Smoker    Packs/day: 0.75    Years: 28.00    Pack years: 21.00    Types: Cigarettes    Start date: 12/08/1988    Quit date: 12/11/2016    Years since quitting: 1.8  . Smokeless tobacco: Never Used  Substance and Sexual Activity  . Alcohol use: No    Alcohol/week: 0.0 standard drinks  . Drug use: No  . Sexual activity: Yes    Partners: Male    Birth control/protection: Other-see comments    Comment: Hysterectomy  Lifestyle  . Physical activity    Days per week: 2 days    Minutes per session: 20 min  . Stress: Not at all  Relationships  . Social Herbalist on phone: More than three times a week    Gets together: Never    Attends religious service: Never    Active member of club or organization: No    Attends meetings of clubs or organizations: Never    Relationship status: Married  . Intimate partner violence    Fear of current or ex partner: No    Emotionally abused: No    Physically abused: No    Forced sexual activity: No  Other Topics Concern  . Not on file  Social History Narrative   Married, she has a grown daughter that has autism.    She lost her job at [employer] on 10/22/2015 ( worker there for 10 years), after she used all her FMLA for medical problems. She was fired the day after her short term disability ran out and she was getting ready to have carpal tunnel repair and right thumb replacement.      Current Outpatient Medications:  .  amitriptyline (ELAVIL) 25 MG tablet, Take 1 tablet by mouth every evening., Disp: , Rfl:  .  amphetamine-dextroamphetamine (ADDERALL) 10 MG tablet, Take 1-20 tablets by mouth 2 (two) times daily., Disp: , Rfl:  .  aspirin 81 MG tablet, Take by mouth., Disp: ,  Rfl:  .  betamethasone dipropionate (DIPROLENE) 0.05 % cream, Apply 1 g topically daily as needed., Disp: , Rfl:  .  Biotin (BIOTIN 5000) 5 MG CAPS, Take by mouth., Disp: , Rfl:  .  Cyanocobalamin (B-12) 1000 MCG SUBL, Place 1 tablet under the tongue 3 (three) times a week., Disp: 48 each, Rfl: 1 .  cyclobenzaprine (FLEXERIL) 5 MG tablet, Take 1 tablet by mouth at bedtime., Disp: , Rfl: 0 .  cycloSPORINE (RESTASIS) 0.05 % ophthalmic emulsion, Place 1 drop into both eyes 2 (two) times daily., Disp: , Rfl:  .  estradiol (ESTRACE) 0.5 MG tablet, TAKE 1 TABLET DAILY, Disp: 90 tablet, Rfl: 3 .  FLUoxetine (PROZAC) 20 MG capsule, TAKE 1 CAPSULE DAILY (DISCONTINUE DULOXETINE), Disp: 90 capsule, Rfl: 1 .  folic acid (FOLVITE) 1 MG tablet, Take 2 tablets by mouth daily., Disp: , Rfl:  .  furosemide (LASIX) 40 MG tablet, Take 1 tablet (40 mg total) by mouth daily., Disp: 90 tablet, Rfl: 1 .  hydroxychloroquine (PLAQUENIL) 200 MG tablet, Take 1 tablet by mouth 2 (two) times daily., Disp: , Rfl:  .  methotrexate 50 MG/2ML injection, Inject 0.8 mLs into the skin once a week., Disp: , Rfl:  .  Multiple Vitamins-Minerals (MULTI VITAMIN/MINERALS) TABS, Take by mouth., Disp: , Rfl:  .  NARCAN 4 MG/0.1ML LIQD nasal spray kit, , Disp: , Rfl:  .  ondansetron (ZOFRAN) 4 MG tablet, Take 1 tablet (4 mg total) by mouth every 8 (eight) hours as needed for nausea or vomiting., Disp: 30 tablet, Rfl: 0 .  Oxycodone HCl 10 MG TABS, Take 1 tablet by mouth 5 (five) times daily., Disp: , Rfl:  .  pilocarpine (SALAGEN) 5 MG tablet, Take 1 tablet by mouth 3 (three) times daily. For dry mouth, Disp: , Rfl:  .  potassium chloride SA (K-DUR) 20 MEQ tablet, TAKE 1 TABLET TWICE A DAY, Disp: 180 tablet, Rfl: 3 .  pravastatin (PRAVACHOL) 40 MG tablet, TAKE 1 TABLET EVERY EVENING FOR CHOLESTEROL, Disp: 90 tablet, Rfl: 3 .  ULORIC 40 MG tablet, Take 1 tablet by mouth daily., Disp: , Rfl:  .  Vitamin D, Ergocalciferol, (DRISDOL) 50000  units CAPS capsule, Take 1 capsule by mouth once a week., Disp: , Rfl:  .  methocarbamol (ROBAXIN) 750 MG tablet, , Disp: , Rfl:   Allergies  Allergen Reactions  . Tomato Hives  . Allopurinol     Patient-reported  . Gabapentin Other (See Comments) and Swelling  . Meperidine   . Tramadol     I personally reviewed active problem list, medication list, allergies, notes from last encounter, lab results with the patient/caregiver today.   ROS  Ten systems reviewed and is negative except as mentioned in HPI  Objective  Virtual encounter, vitals not obtained.  There is no height or weight on file to calculate BMI.  Physical Exam  Constitutional: Patient appears well-developed and well-nourished. No distress.  HENT: Head: Normocephalic and atraumatic.  Neck: Normal range of motion. Pulmonary/Chest: Effort normal. No respiratory distress. Speaking in complete sentences Neurological: Pt is alert and oriented to person, place, and time. Coordination, speech are normal.  Psychiatric: Patient has a normal mood and affect. behavior is normal. Judgment and thought content normal.  No results found for this or any previous visit (from the past 72 hour(s)).  PHQ2/9: Depression screen [facility] 2/9 10/08/2018 07/31/2018 05/14/2018 10/31/2017 10/31/2017  Decreased Interest 2 1 0 0 0  Down, Depressed, Hopeless '2 1 3 ' 0 0  PHQ - 2 Score '4 2 3 ' 0 0  Altered sleeping '3 1 3 ' 0 -  Tired, decreased energy '3 3 3 3 ' -  Change in appetite 0 '3 1 3 ' -  Feeling bad or failure about yourself  3 0 3 3 -  Trouble concentrating 3 0 2 1 -  Moving slowly or fidgety/restless 2 0 0 1 -  Suicidal thoughts 0 0 0 1 -  PHQ-9 Score '18 9 15 12 ' -  Difficult doing work/chores Very difficult Somewhat difficult Extremely dIfficult Very difficult -  Some recent data  might be hidden   PHQ-2/9 Result is positive.    Fall Risk: Fall Risk  10/08/2018 07/31/2018 05/14/2018 10/31/2017 08/22/2017  Falls in the past year? '1 1 1 ' No No   Number falls in past yr: 1 1 0 - -  Injury with Fall? 0 0 0 - -  Risk Factor Category  - - - - -  Risk for fall due to : History of fall(s) Orthopedic patient - - -  Risk for fall due to: Comment - Joint Pain - - -  Follow up Falls evaluation completed - Falls evaluation completed - -    Assessment & Plan  1. Moderate episode of recurrent major depressive disorder (HCC) - FLUoxetine (PROZAC) 40 MG capsule; Take 1 capsule (40 mg total) by mouth daily.  Dispense: 90 capsule; Refill: 0 - Strongly recommend counseling in addition to prozac, she does not feel that this has helped int he past. - Increase to 56m (1.5 tablets of the 232mtablets) for 7 days, then increase to 4078m I discussed the assessment and treatment plan with the patient. The patient was provided an opportunity to ask questions and all were answered. The patient agreed with the plan and demonstrated an understanding of the instructions.  The patient was advised to call back or seek an in-person evaluation if the symptoms worsen or if the condition fails to improve as anticipated.  I provided 13 minutes of non-face-to-face time during this encounter.

## 2018-11-08 ENCOUNTER — Telehealth: Payer: Self-pay

## 2018-11-08 NOTE — Telephone Encounter (Signed)
Copied from [place] [id number]. Topic: General - Other >> Nov 08, 2018  2:42 PM Rainey Pines A wrote: Patient would like a callback from Dr. Ancil Boozer nurse in regards to a lab result from rheumatologist concerning patient having 25,000-50,000 group b strep in urine .

## 2018-11-10 ENCOUNTER — Encounter: Payer: Self-pay | Admitting: Family Medicine

## 2018-11-12 NOTE — Telephone Encounter (Signed)
Called patient. She has results and will bring them with her to her appointment on Friday.

## 2018-11-15 ENCOUNTER — Encounter: Payer: Self-pay | Admitting: Family Medicine

## 2018-11-15 ENCOUNTER — Ambulatory Visit (INDEPENDENT_AMBULATORY_CARE_PROVIDER_SITE_OTHER): Admitting: Family Medicine

## 2018-11-15 ENCOUNTER — Other Ambulatory Visit: Payer: Self-pay

## 2018-11-15 VITALS — BP 106/80 | HR 105 | Temp 96.9°F | Resp 16 | Ht 62.75 in | Wt 197.0 lb

## 2018-11-15 DIAGNOSIS — M359 Systemic involvement of connective tissue, unspecified: Secondary | ICD-10-CM | POA: Diagnosis not present

## 2018-11-15 DIAGNOSIS — Z23 Encounter for immunization: Secondary | ICD-10-CM | POA: Diagnosis not present

## 2018-11-15 DIAGNOSIS — M797 Fibromyalgia: Secondary | ICD-10-CM

## 2018-11-15 DIAGNOSIS — B373 Candidiasis of vulva and vagina: Secondary | ICD-10-CM

## 2018-11-15 DIAGNOSIS — Z79899 Other long term (current) drug therapy: Secondary | ICD-10-CM

## 2018-11-15 DIAGNOSIS — F331 Major depressive disorder, recurrent, moderate: Secondary | ICD-10-CM | POA: Diagnosis not present

## 2018-11-15 DIAGNOSIS — R5382 Chronic fatigue, unspecified: Secondary | ICD-10-CM

## 2018-11-15 DIAGNOSIS — B3731 Acute candidiasis of vulva and vagina: Secondary | ICD-10-CM

## 2018-11-15 DIAGNOSIS — G894 Chronic pain syndrome: Secondary | ICD-10-CM

## 2018-11-15 DIAGNOSIS — N183 Chronic kidney disease, stage 3 unspecified: Secondary | ICD-10-CM

## 2018-11-15 DIAGNOSIS — D8989 Other specified disorders involving the immune mechanism, not elsewhere classified: Secondary | ICD-10-CM

## 2018-11-15 DIAGNOSIS — B370 Candidal stomatitis: Secondary | ICD-10-CM

## 2018-11-15 DIAGNOSIS — M3503 Sicca syndrome with myopathy: Secondary | ICD-10-CM

## 2018-11-15 DIAGNOSIS — K219 Gastro-esophageal reflux disease without esophagitis: Secondary | ICD-10-CM

## 2018-11-15 DIAGNOSIS — R7303 Prediabetes: Secondary | ICD-10-CM

## 2018-11-15 MED ORDER — NYSTATIN 100000 UNIT/ML MT SUSP: 5.0000 mL | Freq: Four times a day (QID) | OROMUCOSAL | 0 refills | Status: AC

## 2018-11-15 MED ORDER — PREGABALIN 50 MG PO CAPS: 50.0000 mg | ORAL_CAPSULE | Freq: Three times a day (TID) | ORAL | 0 refills | Status: AC

## 2018-11-15 MED ORDER — FLUCONAZOLE 150 MG PO TABS: 150.0000 mg | ORAL_TABLET | ORAL | 0 refills | Status: DC

## 2018-11-15 MED ORDER — FAMOTIDINE 40 MG PO TABS: 40.0000 mg | ORAL_TABLET | Freq: Every day | ORAL | 1 refills | Status: DC

## 2018-11-15 MED ORDER — TRULICITY 1.5 MG/0.5ML ~~LOC~~ SOAJ: 1.5000 mg | SUBCUTANEOUS | 1 refills | Status: DC

## 2018-11-15 NOTE — Progress Notes (Signed)
Name: Tracy [name]   MRN: [id number]    DOB: [birth year]   Date:11/15/2018       Progress Note  Subjective  Chief Complaint  Chief Complaint  Patient presents with  . Hypertension  . Hypothyroidism  . Dyslipidemia  . Depression    HPI   Sjogrens  Sees Rheumatologist- Dr. [facility] was, still has myalgias, dry mouth, dry eyes and daily fatigue-She gets Adderal from Dr. Alroy Dust and it helps with fatigue. Currently on Methotrexate and plaquenil for symptomsstill has daily pain , it is about 7/10 at this time  Failed back syndrome and chronic pain   She had decompression lumbar spine Dec 2019, symptoms improved for a couple of weeks but got gradually worse again, back to square one now and is not candidate for injections at this time. She still seeing pain clinic. She has intermittent radiculitis on right lower leg. No bowel or bladder incontinence. Recently she has also noticed radiation up her spine over the past 6 weeks when she flexes. She will call back if she decides to have a second opinion and will give me the name of doctor she would like to see  Obesity  Insurancewont cover any weight loss medications. Patient states she has tried metformin but was having uncontrollable diarrhea, she was on trulicity but stopped because of LUQ pain, however lipase was negative, and once pain clinic changed from methocarbamol to flexeril the pain resolved. She has lost a few pounds since last visit and would like to try trulicity again.   FMS/Connective Tissue Disorder  she is always in pain, [facility],she also has recurrent cervical lymphadenopathy and periods of sore throat,fever. She is off Duloxetine and feels a little worse and we will try Lyrica to see if she can tolerate it, she has been on gabapentin but it caused mental fogginess  Pre-diabetes   she denies polyphagia, nopolydipsia, but sips on water  because of Sjogren's, she has polyuria from lasix. She could not tolerate Metformin, she states it increased her appetite and caused weight gain. .Last hgbA1C was 6.1%. Discussed GLP-1 agonist, no family history of thyroid cancer or personal history of pancreatitis.   Dyslipidemia GERD  she is taking Pravastatin, still has daily muscles aches from FMS. LDL is at goal, discussed ways to bring triglycerides down. Continue medications  Major Depression    Symptoms are worse , she states not approved for disability, in pain all the time and worse since failed back surgery in Dec. Feels bad because she cannot help out at home. She does have a long history of depression, since her 14's. She has taken alprazolam for anxiety in the past , tried Wellbutrin and Prozac. She was switched to duloxetine because of neuropathy. She has been on Prozac since March , dose increased in July when her symptoms of depression got worse. She feels bad since she cannot help financially at home and was denied disability again   GERD:   She continues to have heart burn, she is off ranitidine because of recall, did not respond to pepcid as well, but needs to resume medication and states H2 blockers usually works bets for her. She denies dysphagia  Positive group b strep urine culture:  The urine was collected because she had some dysuria, it was less than 50k , she is symptoms free now, advised not to treat at this time, offered to repeat urine culture but she could  not void today  Right hip pain:  Seen by Dr. Rudene Christians and needs labral repair and femuroplasty and acetabuloplasty, she is afraid of having it done at this time because of COVID-19, she is thinking about holding off for now. She states both hips hurts but right has been worse now   Recurrent yeast infections  She has recurrent candidiasis on her mouth or yeast vaginitis, she uses nystatin or diflucan and would like a refill   Patient Active Problem List    Diagnosis Date Noted  . Prediabetes 05/14/2018  . Moderate episode of recurrent major depressive disorder ([place]) 05/22/2017  . Elevated uric acid in blood 04/28/2016  . Degenerative disc disease, lumbar 05/26/2015  . Lumbar herniated disc 05/26/2015  . Connective tissue disorder ([place]) 12/09/2014  . 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 Fibromyalgia 12/09/2014  . Abnormal ECG 12/08/2014  . Abnormal antinuclear antibody titer 12/08/2014  . Major depression, chronic 12/08/2014  . Edema leg 12/08/2014  . 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 Dyslipidemia 12/08/2014  . Family history of breast cancer 12/08/2014  . H/O: hysterectomy 12/08/2014  . Dysmetabolic syndrome 64/33/2951  . Obesity (BMI 30-39.9) 12/08/2014  . Disorder of tendon 12/08/2014  . Sjogren's syndrome ([place]) 12/08/2014  . Allergic rhinitis, seasonal 12/08/2014  . Snores 12/08/2014  . Tenosynovitis of thumb 12/02/2014    Past Surgical History:  Procedure Laterality Date  . ABDOMINAL HYSTERECTOMY    . BREAST REDUCTION SURGERY Bilateral 2000   [facility]  . CARPAL TUNNEL RELEASE Right 12/16/2015  . CARPAL TUNNEL RELEASE Left 04/18/2016   also left thumb replacement  . CHOLECYSTECTOMY    . ENDOMETRIAL ABLATION     ovarian cyst removal  . JOINT REPLACEMENT Right 12/16/2015   right thumb  . LUMBAR LAMINECTOMY  02/19/2018  . NASAL SINUS SURGERY    . REDUCTION MAMMAPLASTY  2000   september  . TUBAL LIGATION      Family History  Problem Relation Age of Onset  . Hypertension Mother   . Allergic rhinitis Mother   . Hypertension Father   . Alcohol abuse Father   . Heart disease Father   . Alcohol abuse Brother   . Hypertension Brother   . Breast cancer Maternal Aunt 30       two aunts  . Breast cancer Paternal Aunt   . Breast cancer Maternal Grandmother   . Breast cancer Cousin 9       paternal cousin     Social History   Socioeconomic History  . Marital status: Married    Spouse name: Glendell Docker  . Number of children: 1  . Years of education: Not on file  . Highest education level: Some college, no degree  Occupational History  . Occupation: disabled    Comment: used to work at [employer]  . Financial resource strain: Not hard at all  . Food insecurity    Worry: Never true    Inability: Never true  . Transportation needs    Medical: No    Non-medical: No  Tobacco Use  . Smoking status: Former Smoker    Packs/day: 0.75    Years: 28.00    Pack years: 21.00    Types: Cigarettes    Start date: 12/08/1988    Quit date: 12/11/2016    Years since quitting: 1.9  . Smokeless tobacco: Never Used  Substance and Sexual Activity  .  Alcohol use: No    Alcohol/week: 0.0 standard drinks  . Drug use: No  . Sexual activity: Yes    Partners: Male    Birth control/protection: Other-see comments    Comment: Hysterectomy  Lifestyle  . Physical activity    Days per week: 2 days    Minutes per session: 20 min  . Stress: Not at all  Relationships  . Social Herbalist on phone: More than three times a week    Gets together: Never    Attends religious service: Never    Active member of club or organization: No    Attends meetings of clubs or organizations: Never    Relationship status: Married  . Intimate partner violence    Fear of current or ex partner: No    Emotionally abused: No    Physically abused: No    Forced sexual activity: No  Other Topics Concern  . Not on file  Social History Narrative   Married, she has a grown daughter that has autism.    She lost her job at [employer] on 10/22/2015 ( worker there for 10 years), after she used all her FMLA for medical problems. She was fired the day after her short term disability ran out and she was getting ready to have carpal tunnel repair and right thumb replacement.      Current Outpatient Medications:  .   amitriptyline (ELAVIL) 25 MG tablet, Take 1 tablet by mouth every evening., Disp: , Rfl:  .  amphetamine-dextroamphetamine (ADDERALL) 10 MG tablet, Take 1-20 tablets by mouth 2 (two) times daily., Disp: , Rfl:  .  aspirin 81 MG tablet, Take by mouth., Disp: , Rfl:  .  betamethasone dipropionate (DIPROLENE) 0.05 % cream, Apply 1 g topically daily as needed., Disp: , Rfl:  .  Biotin (BIOTIN 5000) 5 MG CAPS, Take by mouth., Disp: , Rfl:  .  Cyanocobalamin (B-12) 1000 MCG SUBL, Place 1 tablet under the tongue 3 (three) times a week., Disp: 48 each, Rfl: 1 .  cyclobenzaprine (FLEXERIL) 5 MG tablet, Take 10 mg by mouth at bedtime. , Disp: , Rfl: 0 .  cycloSPORINE (RESTASIS) 0.05 % ophthalmic emulsion, Place 1 drop into both eyes 2 (two) times daily., Disp: , Rfl:  .  estradiol (ESTRACE) 0.5 MG tablet, TAKE 1 TABLET DAILY, Disp: 90 tablet, Rfl: 3 .  FLUoxetine (PROZAC) 40 MG capsule, Take 1 capsule (40 mg total) by mouth daily., Disp: 90 capsule, Rfl: 0 .  folic acid (FOLVITE) 1 MG tablet, Take 2 tablets by mouth daily., Disp: , Rfl:  .  furosemide (LASIX) 40 MG tablet, Take 1 tablet (40 mg total) by mouth daily., Disp: 90 tablet, Rfl: 1 .  hydroxychloroquine (PLAQUENIL) 200 MG tablet, Take 1 tablet by mouth 2 (two) times daily., Disp: , Rfl:  .  methotrexate 50 MG/2ML injection, Inject 0.8 mLs into the skin once a week., Disp: , Rfl:  .  Multiple Vitamins-Minerals (MULTI VITAMIN/MINERALS) TABS, Take by mouth., Disp: , Rfl:  .  NARCAN 4 MG/0.1ML LIQD nasal spray kit, , Disp: , Rfl:  .  ondansetron (ZOFRAN) 4 MG tablet, Take 1 tablet (4 mg total) by mouth every 8 (eight) hours as needed for nausea or vomiting., Disp: 30 tablet, Rfl: 0 .  Oxycodone HCl 10 MG TABS, Take 1 tablet by mouth 5 (five) times daily., Disp: , Rfl:  .  pilocarpine (SALAGEN) 5 MG tablet, Take 1 tablet by mouth 3 (three) times daily.  For dry mouth, Disp: , Rfl:  .  potassium chloride SA (K-DUR) 20 MEQ tablet, TAKE 1 TABLET TWICE A  DAY, Disp: 180 tablet, Rfl: 3 .  pravastatin (PRAVACHOL) 40 MG tablet, TAKE 1 TABLET EVERY EVENING FOR CHOLESTEROL, Disp: 90 tablet, Rfl: 3 .  ULORIC 40 MG tablet, Take 1 tablet by mouth daily., Disp: , Rfl:  .  Vitamin D, Ergocalciferol, (DRISDOL) 50000 units CAPS capsule, Take 1 capsule by mouth once a week., Disp: , Rfl:   Allergies  Allergen Reactions  . Robaxin [Methocarbamol] Nausea And Vomiting  . Tomato Hives  . Allopurinol     Patient-reported  . Gabapentin Other (See Comments) and Swelling  . Meperidine   . Tramadol     I personally reviewed active problem list, medication list, allergies, family history, social history, health maintenance with the patient/caregiver today.   ROS  Constitutional: Negative for fever or weight change.  Respiratory: Negative for cough and shortness of breath.   Cardiovascular: Negative for chest pain or palpitations.  Gastrointestinal: Negative for abdominal pain, no bowel changes.  Musculoskeletal: Negative for gait problem or joint swelling.  Skin: Negative for rash.  Neurological: Negative for dizziness or headache.  No other specific complaints in a complete review of systems (except as listed in HPI above).  Objective  Vitals:   11/15/18 1319  BP: 106/80  Pulse: (!) 105  Resp: 16  Temp: (!) 96.9 F (36.1 C)  TempSrc: Temporal  SpO2: 94%  Weight: 197 lb (89.4 kg)  Height: 5' 2.75" (1.594 m)    Body mass index is 35.18 kg/m.  Physical Exam  Constitutional: Patient appears well-developed and well-nourished. Obese  No distress.  HEENT: head atraumatic, normocephalic, pupils equal and reactive to light,  neck supple, Cardiovascular: Normal rate, regular rhythm and normal heart sounds.  No murmur heard. No BLE edema. Pulmonary/Chest: Effort normal and breath sounds normal. No respiratory distress. Abdominal: Soft.  There is no tenderness. Psychiatric: Patient has a normal mood and affect. behavior is normal. Judgment and  thought content normal.  PHQ2/9: Depression screen [facility] 2/9 11/15/2018 10/08/2018 07/31/2018 05/14/2018 10/31/2017  Decreased Interest '2 2 1 ' 0 0  Down, Depressed, Hopeless '2 2 1 3 ' 0  PHQ - 2 Score '4 4 2 3 ' 0  Altered sleeping '3 3 1 3 ' 0  Tired, decreased energy '3 3 3 3 3  ' Change in appetite 1 0 '3 1 3  ' Feeling bad or failure about yourself  3 3 0 3 3  Trouble concentrating 2 3 0 2 1  Moving slowly or fidgety/restless 1 2 0 0 1  Suicidal thoughts 0 0 0 0 1  PHQ-9 Score '17 18 9 15 12  ' Difficult doing work/chores Very difficult Very difficult Somewhat difficult Extremely dIfficult Very difficult  Some recent data might be hidden    phq 9 is positive  Fall Risk: Fall Risk  11/15/2018 10/08/2018 07/31/2018 05/14/2018 10/31/2017  Falls in the past year? 0 '1 1 1 ' No  Number falls in past yr: 0 1 1 0 -  Injury with Fall? 0 0 0 0 -  Risk Factor Category  - - - - -  Risk for fall due to : - History of fall(s) Orthopedic patient - -  Risk for fall due to: Comment - - Joint Pain - -  Follow up - Falls evaluation completed - Falls evaluation completed -     Functional Status Survey: Is the patient deaf or have difficulty hearing?: Yes  Does the patient have difficulty seeing, even when wearing glasses/contacts?: Yes Does the patient have difficulty concentrating, remembering, or making decisions?: Yes Does the patient have difficulty walking or climbing stairs?: Yes Does the patient have difficulty dressing or bathing?: Yes Does the patient have difficulty doing errands alone such as visiting a doctor's office or shopping?: No   Assessment & Plan  1. Need for immunization against influenza  - Flu Vaccine QUAD 36+ mos IM  2. CFIDS (chronic fatigue and immune dysfunction syndrome) (HCC)  stable  3. Connective tissue disorder ([place])  Under the care of Rheumatologist   4. Moderate episode of recurrent major depressive disorder ([place])  Stable on prozac , discussed referral to psychiatrist - she will  contact insurance to see if covered by her insurance   5. 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 GERD without esophagitis  - famotidine (PEPCID) 40 MG tablet; Take 1 tablet (40 mg total) by mouth daily.  Dispense: 90 tablet; Refill: 1  7. Fibromyalgia  - pregabalin (LYRICA) 50 MG capsule; Take 1 capsule (50 mg total) by mouth 3 (three) times daily.  Dispense: 90 capsule; Refill: 0  8. Prediabetes  - Hemoglobin A1c - Insulin, Free and Total - Dulaglutide (TRULICITY) 1.5 TE/4.3DT SOPN; Inject 1.5 mg into the skin once a week.  Dispense: 12 pen; Refill: 1  9. Long-term use of high-risk medication   10. Stage 3 chronic kidney disease (HCC)  - Comprehensive metabolic panel  11. Chronic pain syndrome   12. Yeast vaginitis  - nystatin (MYCOSTATIN) 100000 UNIT/ML suspension; Take 5 mLs (500,000 Units total) by mouth 4 (four) times daily.  Dispense: 60 mL; Refill: 0  13. Oral candidiasis  - fluconazole (DIFLUCAN) 150 MG tablet; Take 1 tablet (150 mg total) by mouth every other day. For 3 days prn  Dispense: 12 tablet; Refill: 0

## 2018-11-25 ENCOUNTER — Ambulatory Visit (INDEPENDENT_AMBULATORY_CARE_PROVIDER_SITE_OTHER): Admitting: Family Medicine

## 2018-11-25 ENCOUNTER — Ambulatory Visit: Payer: Self-pay

## 2018-11-25 ENCOUNTER — Encounter: Payer: Self-pay | Admitting: Family Medicine

## 2018-11-25 ENCOUNTER — Other Ambulatory Visit: Payer: Self-pay

## 2018-11-25 VITALS — BP 104/66 | HR 106 | Temp 97.5°F | Resp 20 | Ht 62.75 in | Wt 202.3 lb

## 2018-11-25 DIAGNOSIS — R8290 Unspecified abnormal findings in urine: Secondary | ICD-10-CM

## 2018-11-25 DIAGNOSIS — T887XXA Unspecified adverse effect of drug or medicament, initial encounter: Secondary | ICD-10-CM

## 2018-11-25 NOTE — Telephone Encounter (Signed)
  Patient called stating that she is having a reaction to Lyrica. She states it started last night after taking her dose.  She started to feel like her nose was stuffy.  Today she states that her tongue feels tingly and her upper lip is swollen. She denies breathing and swallowing problems. She has no rash. She has taken 2 benadryl per her pharmacist advice. Care advice read to patient. She verbalized understanding. Call place to [place] with Dr Ancil Boozer.  Ok to see patient in office today. Patient will be at office by 1300 this afternoon.  She will watch for progression of her symptoms and go to ER if difficulty swallowing or breathing occur. Reason for Disposition . [1] Severe swelling AND [2] cause unknown  Answer Assessment - Initial Assessment Questions 1. ONSET: "When did the swelling start?" (e.g., minutes, hours, days)     Last night after taking Lyrica 2. SEVERITY: "How swollen is it?"     visible 3. ITCHING: "Is there any itching?" If so, ask: "How much?"   (Scale 1-10; mild, moderate or severe)    no 4. PAIN: "Is the swelling painful to touch?" If so, ask: "How painful is it?"   (Scale 1-10; mild, moderate or severe)     No tingling in mouth 5. CAUSE: "What do you think is causing the lip swelling?"    lyrica 6. RECURRENT SYMPTOM: "Have you had lip swelling before?" If so, ask: "When was the last time?" "What happened that time?"    no 7. OTHER SYMPTOMS: "Do you have any other symptoms?" (e.g., toothache)    no 8. PREGNANCY: "Is there any chance you are pregnant?" "When was your last menstrual period?"    N/A  Protocols used: [facility]

## 2018-11-25 NOTE — Addendum Note (Signed)
Addended by: Inda Coke on: 11/25/2018 01:32 PM   Modules accepted: Orders

## 2018-11-25 NOTE — Progress Notes (Signed)
Name: Tracy [name]   MRN: [id number]    DOB: 1971-02-26   Date:11/25/2018       Progress Note  Subjective  Chief Complaint  Chief Complaint  Patient presents with  . Medication Reaction    Since starting Lyrica- she has taken 2 pills on Thursday and 3 pills Friday, Sat and Sunday and 1 today. Gained 5 pounds since, itchy, sore throat, tongue numbness, dysphagia, dry mouth, itchy nose, feels out of it like she is spaced out.  Pharmacist told her to have 2 benadryl to help symptoms and very jittery.    HPI  Possible side effects of Lyrica: she states symptoms started last night with some sore throat and felt like she was going to have a sinus infection. She states worse symptoms were this am with some angioedema of lips, tongue felt big, itchy nose, no wheezing or stridor. She called after hours and spoke to nurse who advised her to take Benadryl and is feeling better still has dry mouth worse than normal sjogrens and itchy nose. She is also upset about the weight gain.   Urine odor; previous test was positive for group b strep but no other symptoms, we will send for urine culture today   Patient Active Problem List   Diagnosis Date Noted  . Prediabetes 05/14/2018  . Moderate episode of recurrent major depressive disorder ([place]) 05/22/2017  . Elevated uric acid in blood 04/28/2016  . Degenerative disc disease, lumbar 05/26/2015  . Lumbar herniated disc 05/26/2015  . Connective tissue disorder ([place]) 12/09/2014  . 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 Fibromyalgia 12/09/2014  . Abnormal ECG 12/08/2014  . Abnormal antinuclear antibody titer 12/08/2014  . Major depression, chronic 12/08/2014  . Edema leg 12/08/2014  . 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 CFIDS (chronic fatigue and immune dysfunction syndrome) ([place]) 12/08/2014  . 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 Dyslipidemia 12/08/2014  . Family history of breast cancer 12/08/2014  . H/O: hysterectomy 12/08/2014  . Dysmetabolic  syndrome 62/70/3500  . Obesity (BMI 30-39.9) 12/08/2014  . Disorder of tendon 12/08/2014  . Sjogren's syndrome ([place]) 12/08/2014  . Allergic rhinitis, seasonal 12/08/2014  . Snores 12/08/2014  . Tenosynovitis of thumb 12/02/2014    Past Surgical History:  Procedure Laterality Date  . ABDOMINAL HYSTERECTOMY    . BREAST REDUCTION SURGERY Bilateral 2000   [facility]  . CARPAL TUNNEL RELEASE Right 12/16/2015  . CARPAL TUNNEL RELEASE Left 04/18/2016   also left thumb replacement  . CHOLECYSTECTOMY    . ENDOMETRIAL ABLATION     ovarian cyst removal  . JOINT REPLACEMENT Right 12/16/2015   right thumb  . LUMBAR LAMINECTOMY  02/19/2018  . NASAL SINUS SURGERY    . REDUCTION MAMMAPLASTY  2000   september  . TUBAL LIGATION      Family History  Problem Relation Age of Onset  . Hypertension Mother   . Allergic rhinitis Mother   . Hypertension Father   . Alcohol abuse Father   . Heart disease Father   . Alcohol abuse Brother   . Hypertension Brother   . Breast cancer Maternal Aunt 30       two aunts  . Breast cancer Paternal Aunt   . Breast cancer Maternal Grandmother   . Breast cancer Cousin 51       paternal cousin    Social History   Socioeconomic History  . Marital status: Married    Spouse name: Glendell Docker  . Number of children: 1  . Years  of education: Not on file  . Highest education level: Some college, no degree  Occupational History  . Occupation: disabled    Comment: used to work at [employer]  . Financial resource strain: Not hard at all  . Food insecurity    Worry: Never true    Inability: Never true  . Transportation needs    Medical: No    Non-medical: No  Tobacco Use  . Smoking status: Former Smoker    Packs/day: 0.75    Years: 28.00    Pack years: 21.00    Types: Cigarettes    Start date: 12/08/1988    Quit date: 12/11/2016    Years since quitting: 1.9  . Smokeless tobacco: Never Used  Substance and Sexual Activity  . Alcohol use: No     Alcohol/week: 0.0 standard drinks  . Drug use: No  . Sexual activity: Yes    Partners: Male    Birth control/protection: Other-see comments    Comment: Hysterectomy  Lifestyle  . Physical activity    Days per week: 2 days    Minutes per session: 20 min  . Stress: Not at all  Relationships  . Social Herbalist on phone: More than three times a week    Gets together: Never    Attends religious service: Never    Active member of club or organization: No    Attends meetings of clubs or organizations: Never    Relationship status: Married  . Intimate partner violence    Fear of current or ex partner: No    Emotionally abused: No    Physically abused: No    Forced sexual activity: No  Other Topics Concern  . Not on file  Social History Narrative   Married, she has a grown daughter that has autism.    She lost her job at [employer] on 10/22/2015 ( worker there for 10 years), after she used all her FMLA for medical problems. She was fired the day after her short term disability ran out and she was getting ready to have carpal tunnel repair and right thumb replacement.      Current Outpatient Medications:  .  amitriptyline (ELAVIL) 25 MG tablet, Take 1 tablet by mouth every evening., Disp: , Rfl:  .  amphetamine-dextroamphetamine (ADDERALL) 10 MG tablet, Take 1-20 tablets by mouth 2 (two) times daily., Disp: , Rfl:  .  aspirin 81 MG tablet, Take by mouth., Disp: , Rfl:  .  betamethasone dipropionate (DIPROLENE) 0.05 % cream, Apply 1 g topically daily as needed., Disp: , Rfl:  .  Biotin (BIOTIN 5000) 5 MG CAPS, Take by mouth., Disp: , Rfl:  .  Cyanocobalamin (B-12) 1000 MCG SUBL, Place 1 tablet under the tongue 3 (three) times a week., Disp: 48 each, Rfl: 1 .  cyclobenzaprine (FLEXERIL) 5 MG tablet, Take 10 mg by mouth at bedtime. , Disp: , Rfl: 0 .  cycloSPORINE (RESTASIS) 0.05 % ophthalmic emulsion, Place 1 drop into both eyes 2 (two) times daily., Disp: , Rfl:  .  Dulaglutide  (TRULICITY) 1.5 EZ/6.6QH SOPN, Inject 1.5 mg into the skin once a week., Disp: 12 pen, Rfl: 1 .  estradiol (ESTRACE) 0.5 MG tablet, TAKE 1 TABLET DAILY, Disp: 90 tablet, Rfl: 3 .  famotidine (PEPCID) 40 MG tablet, Take 1 tablet (40 mg total) by mouth daily., Disp: 90 tablet, Rfl: 1 .  fluconazole (DIFLUCAN) 150 MG tablet, Take 1 tablet (150 mg total) by mouth every  other day. For 3 days prn, Disp: 12 tablet, Rfl: 0 .  FLUoxetine (PROZAC) 40 MG capsule, Take 1 capsule (40 mg total) by mouth daily., Disp: 90 capsule, Rfl: 0 .  folic acid (FOLVITE) 1 MG tablet, Take 2 tablets by mouth daily., Disp: , Rfl:  .  furosemide (LASIX) 40 MG tablet, Take 1 tablet (40 mg total) by mouth daily., Disp: 90 tablet, Rfl: 1 .  hydroxychloroquine (PLAQUENIL) 200 MG tablet, Take 1 tablet by mouth 2 (two) times daily., Disp: , Rfl:  .  methotrexate 50 MG/2ML injection, Inject 0.8 mLs into the skin once a week., Disp: , Rfl:  .  Multiple Vitamins-Minerals (MULTI VITAMIN/MINERALS) TABS, Take by mouth., Disp: , Rfl:  .  NARCAN 4 MG/0.1ML LIQD nasal spray kit, , Disp: , Rfl:  .  nystatin (MYCOSTATIN) 100000 UNIT/ML suspension, Take 5 mLs (500,000 Units total) by mouth 4 (four) times daily., Disp: 60 mL, Rfl: 0 .  ondansetron (ZOFRAN) 4 MG tablet, Take 1 tablet (4 mg total) by mouth every 8 (eight) hours as needed for nausea or vomiting., Disp: 30 tablet, Rfl: 0 .  Oxycodone HCl 10 MG TABS, Take 1 tablet by mouth 5 (five) times daily., Disp: , Rfl:  .  pilocarpine (SALAGEN) 5 MG tablet, Take 1 tablet by mouth 3 (three) times daily. For dry mouth, Disp: , Rfl:  .  potassium chloride SA (K-DUR) 20 MEQ tablet, TAKE 1 TABLET TWICE A DAY, Disp: 180 tablet, Rfl: 3 .  pravastatin (PRAVACHOL) 40 MG tablet, TAKE 1 TABLET EVERY EVENING FOR CHOLESTEROL, Disp: 90 tablet, Rfl: 3 .  pregabalin (LYRICA) 50 MG capsule, Take 1 capsule (50 mg total) by mouth 3 (three) times daily., Disp: 90 capsule, Rfl: 0 .  ULORIC 40 MG tablet, Take 1  tablet by mouth daily., Disp: , Rfl:  .  Vitamin D, Ergocalciferol, (DRISDOL) 50000 units CAPS capsule, Take 1 capsule by mouth once a week., Disp: , Rfl:   Allergies  Allergen Reactions  . Robaxin [Methocarbamol] Nausea And Vomiting  . Tomato Hives  . Allopurinol     Patient-reported  . Gabapentin Other (See Comments) and Swelling  . Meperidine   . Tramadol     I personally reviewed active problem list, medication list, allergies, family history, social history, health maintenance with the patient/caregiver today.   ROS  Ten systems reviewed and is negative except as mentioned in HPI   Objective  Vitals:   11/25/18 1232  BP: 104/66  Pulse: (!) 106  Resp: 20  Temp: (!) 97.5 F (36.4 C)  TempSrc: Temporal  SpO2: 99%  Weight: 202 lb 4.8 oz (91.8 kg)  Height: 5' 2.75" (1.594 m)    Body mass index is 36.12 kg/m.  Physical Exam  Constitutional: Patient appears well-developed and well-nourished. Obese  No distress.  HEENT: head atraumatic, normocephalic, pupils equal and reactive to light,  neck supple, throat within normal limits Cardiovascular: Normal rate, regular rhythm and normal heart sounds.  No murmur heard. No BLE edema. Pulmonary/Chest: Effort normal and breath sounds normal. No respiratory distress. Abdominal: Soft.  There is no tenderness. Psychiatric: Patient has a normal mood and affect. behavior is normal. Judgment and thought content normal.   PHQ2/9: Depression screen [facility] 2/9 11/15/2018 10/08/2018 07/31/2018 05/14/2018 10/31/2017  Decreased Interest _0 0 0  Down, Depressed, Hopeless _1 0  PHQ - 2 Score _2 0  Altered sleeping _3 0  Tired, decreased  energy _0 Change in appetite 1 0 _1 Feeling bad or failure about yourself  3 3 0 3 3  Trouble concentrating 2 3 0 2 1  Moving slowly or fidgety/restless 1 2 0 0 1  Suicidal thoughts 0 0 0 0 1  PHQ-9 Score _2 Difficult doing work/chores Very difficult Very difficult  Somewhat difficult Extremely dIfficult Very difficult  Some recent data might be hidden    phq 9 is positive ] Fall Risk: Fall Risk  11/15/2018 10/08/2018 07/31/2018 05/14/2018 10/31/2017  Falls in the past year? 0 _3 No  Number falls in past yr: 0 1 1 0 -  Injury with Fall? 0 0 0 0 -  Risk Factor Category  - - - - -  Risk for fall due to : - History of fall(s) Orthopedic patient - -  Risk for fall due to: Comment - - Joint Pain - -  Follow up - Falls evaluation completed - Falls evaluation completed -     Assessment & Plan  1. Abnormal urine odor  - CULTURE, URINE COMPREHENSIVE; Future  2. Non-dose-related adverse effect of medication, initial encounter  Advised benadryl otc every 6 hours for the next 24 hours

## 2018-12-03 ENCOUNTER — Other Ambulatory Visit: Payer: Self-pay | Admitting: Family Medicine

## 2018-12-03 DIAGNOSIS — Z1231 Encounter for screening mammogram for malignant neoplasm of breast: Secondary | ICD-10-CM

## 2018-12-05 ENCOUNTER — Encounter: Payer: Self-pay | Admitting: Family Medicine

## 2018-12-12 ENCOUNTER — Ambulatory Visit

## 2018-12-13 LAB — HM DIABETES EYE EXAM

## 2018-12-25 ENCOUNTER — Encounter: Payer: Self-pay | Admitting: Family Medicine

## 2018-12-31 ENCOUNTER — Other Ambulatory Visit: Payer: Self-pay | Admitting: Family Medicine

## 2018-12-31 DIAGNOSIS — F331 Major depressive disorder, recurrent, moderate: Secondary | ICD-10-CM

## 2019-01-01 ENCOUNTER — Other Ambulatory Visit: Payer: Self-pay

## 2019-01-01 ENCOUNTER — Ambulatory Visit
Admission: RE | Admit: 2019-01-01 | Discharge: 2019-01-01 | Disposition: A | Discharge status: Home / Self Care | Source: Ambulatory Visit | Attending: Family Medicine | Admitting: Family Medicine

## 2019-01-01 DIAGNOSIS — Z1231 Encounter for screening mammogram for malignant neoplasm of breast: Secondary | ICD-10-CM | POA: Diagnosis present

## 2019-01-06 ENCOUNTER — Other Ambulatory Visit: Payer: Self-pay | Admitting: Family Medicine

## 2019-01-06 DIAGNOSIS — F331 Major depressive disorder, recurrent, moderate: Secondary | ICD-10-CM

## 2019-01-06 LAB — CULTURE, URINE COMPREHENSIVE

## 2019-01-06 MED ORDER — CIPROFLOXACIN HCL 250 MG PO TABS: 250.0000 mg | ORAL_TABLET | Freq: Two times a day (BID) | ORAL | 0 refills | Status: DC

## 2019-01-07 ENCOUNTER — Encounter: Payer: Self-pay | Admitting: Family Medicine

## 2019-01-07 LAB — HEMOGLOBIN A1C: Hemoglobin A1C: 5.6

## 2019-01-07 MED ORDER — FLUOXETINE HCL 40 MG PO CAPS: 40.0000 mg | ORAL_CAPSULE | Freq: Every day | ORAL | 0 refills | Status: DC

## 2019-01-07 NOTE — Telephone Encounter (Signed)
Patient notified

## 2019-01-13 ENCOUNTER — Other Ambulatory Visit: Payer: Self-pay

## 2019-01-13 ENCOUNTER — Encounter: Payer: Self-pay | Admitting: Family Medicine

## 2019-01-13 ENCOUNTER — Encounter: Admitting: Family Medicine

## 2019-01-13 ENCOUNTER — Ambulatory Visit (INDEPENDENT_AMBULATORY_CARE_PROVIDER_SITE_OTHER): Admitting: Family Medicine

## 2019-01-13 VITALS — BP 120/80 | HR 98 | Temp 97.7°F | Resp 16 | Ht 62.75 in | Wt 190.4 lb

## 2019-01-13 DIAGNOSIS — Z560 Unemployment, unspecified: Secondary | ICD-10-CM

## 2019-01-13 DIAGNOSIS — E538 Deficiency of other specified B group vitamins: Secondary | ICD-10-CM

## 2019-01-13 DIAGNOSIS — R8290 Unspecified abnormal findings in urine: Secondary | ICD-10-CM | POA: Diagnosis not present

## 2019-01-13 MED ORDER — CYANOCOBALAMIN 1000 MCG/ML IJ SOLN
1000.0000 ug | Freq: Once | INTRAMUSCULAR | Status: AC
  Administered 2019-01-13: 1000 ug via INTRAMUSCULAR

## 2019-01-13 NOTE — Progress Notes (Signed)
Name: Tracy [name]   MRN: [id number]    DOB: Aug 17, 1970   Date:01/13/2019       Progress Note  Subjective  Chief Complaint  Chief Complaint  Patient presents with  . Pain    HPI  Urine odor: she was recently treated for UTI with Cipro, she states continues to have urine odor, but no dysuria, no nausea , vomiting or fever.   Disabled: she was recently approved for disability and is received social security benefits. She was evaluated by psychiatrist Dr. Temple Pacini and also by [facility] . We only received copies of the psychiatrist note. 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 She has not been working since 04/23/2015, but disability was approved October 2020 but back dated from  10/19/2017  B12 deficiency: she recently saw endocrinologist and b12 was low, she prefers getting injections, does not like sublingual tablets. She is always tired , she has paresthesia that is chronic   Patient Active Problem List   Diagnosis Date Noted  . Prediabetes 05/14/2018  . Moderate episode of recurrent major depressive disorder ([place]) 05/22/2017  . Elevated uric acid in blood 04/28/2016  . Degenerative disc disease, lumbar 05/26/2015  . Lumbar herniated disc 05/26/2015  . Connective tissue disorder ([place]) 12/09/2014  . 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 Fibromyalgia 12/09/2014  . Abnormal ECG 12/08/2014  . Abnormal antinuclear antibody titer 12/08/2014  . Major depression, chronic 12/08/2014  . Edema leg 12/08/2014  . 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 CFIDS (chronic fatigue and immune dysfunction syndrome) ([place]) 12/08/2014  . 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 Dyslipidemia 12/08/2014  . Family history of breast cancer 12/08/2014  . H/O: hysterectomy 12/08/2014  . Dysmetabolic syndrome 64/33/2951  . Obesity (BMI  30-39.9) 12/08/2014  . Disorder of tendon 12/08/2014  . Sjogren's syndrome ([place]) 12/08/2014  . Allergic rhinitis, seasonal 12/08/2014  . Snores 12/08/2014  . Tenosynovitis of thumb 12/02/2014    Past Surgical History:  Procedure Laterality Date  . ABDOMINAL HYSTERECTOMY    . BREAST REDUCTION SURGERY Bilateral 2000   [facility]  . CARPAL TUNNEL RELEASE Right 12/16/2015  . CARPAL TUNNEL RELEASE Left 04/18/2016   also left thumb replacement  . CHOLECYSTECTOMY    . ENDOMETRIAL ABLATION     ovarian cyst removal  . JOINT REPLACEMENT Right 12/16/2015   right thumb  . LUMBAR LAMINECTOMY  02/19/2018  . NASAL SINUS SURGERY    . REDUCTION MAMMAPLASTY  2000   september  . TUBAL LIGATION      Family History  Problem Relation Age of Onset  . Hypertension Mother   . Allergic rhinitis Mother   . Hypertension Father   . Alcohol abuse Father   . Heart disease Father   . Alcohol abuse Brother   . Hypertension Brother   . Breast cancer Maternal Aunt 30       two aunts  . Breast cancer Paternal Aunt   . Breast cancer Maternal Grandmother   . Breast cancer Cousin 9       paternal cousin    Social History   Socioeconomic History  . Marital status: Married    Spouse name: Glendell Docker  . Number of children: 1  . Years of education: Not on file  . Highest education level: Some college, no degree  Occupational History  . Occupation:  disabled    Comment: used to work at [employer]  . Financial resource strain: Not hard at all  . Food insecurity    Worry: Never true    Inability: Never true  . Transportation needs    Medical: No    Non-medical: No  Tobacco Use  . Smoking status: Former Smoker    Packs/day: 0.75    Years: 28.00    Pack years: 21.00    Types: Cigarettes    Start date: 12/08/1988    Quit date: 12/11/2016    Years since quitting: 2.0  . Smokeless tobacco: Never Used  Substance and Sexual Activity  . Alcohol use: No    Alcohol/week: 0.0 standard drinks  .  Drug use: No  . Sexual activity: Yes    Partners: Male    Birth control/protection: Other-see comments    Comment: Hysterectomy  Lifestyle  . Physical activity    Days per week: 2 days    Minutes per session: 20 min  . Stress: Not at all  Relationships  . Social Herbalist on phone: More than three times a week    Gets together: Never    Attends religious service: Never    Active member of club or organization: No    Attends meetings of clubs or organizations: Never    Relationship status: Married  . Intimate partner violence    Fear of current or ex partner: No    Emotionally abused: No    Physically abused: No    Forced sexual activity: No  Other Topics Concern  . Not on file  Social History Narrative   Married, she has a grown daughter that has autism.    She lost her job at [employer] on 10/22/2015 ( worker there for 10 years), after she used all her FMLA for medical problems. She was fired the day after her short term disability ran out and she was getting ready to have carpal tunnel repair and right thumb replacement.      Current Outpatient Medications:  .  amitriptyline (ELAVIL) 25 MG tablet, Take 1 tablet by mouth every evening., Disp: , Rfl:  .  amphetamine-dextroamphetamine (ADDERALL) 10 MG tablet, Take 1-20 tablets by mouth 2 (two) times daily., Disp: , Rfl:  .  aspirin 81 MG tablet, Take by mouth., Disp: , Rfl:  .  betamethasone dipropionate (DIPROLENE) 0.05 % cream, Apply 1 g topically daily as needed., Disp: , Rfl:  .  Biotin (BIOTIN 5000) 5 MG CAPS, Take by mouth., Disp: , Rfl:  .  Cyanocobalamin (B-12) 1000 MCG SUBL, Place 1 tablet under the tongue 3 (three) times a week., Disp: 48 each, Rfl: 1 .  cyclobenzaprine (FLEXERIL) 5 MG tablet, Take 10 mg by mouth at bedtime. , Disp: , Rfl: 0 .  cycloSPORINE (RESTASIS) 0.05 % ophthalmic emulsion, Place 1 drop into both eyes 2 (two) times daily., Disp: , Rfl:  .  Dulaglutide (TRULICITY) 1.5 QI/6.9GE SOPN, Inject  1.5 mg into the skin once a week., Disp: 12 pen, Rfl: 1 .  estradiol (ESTRACE) 0.5 MG tablet, TAKE 1 TABLET DAILY, Disp: 90 tablet, Rfl: 3 .  famotidine (PEPCID) 40 MG tablet, Take 1 tablet (40 mg total) by mouth daily., Disp: 90 tablet, Rfl: 1 .  fluconazole (DIFLUCAN) 150 MG tablet, Take 1 tablet (150 mg total) by mouth every other day. For 3 days prn, Disp: 12 tablet, Rfl: 0 .  FLUoxetine (PROZAC) 40 MG capsule, Take 1  capsule (40 mg total) by mouth daily., Disp: 90 capsule, Rfl: 0 .  folic acid (FOLVITE) 1 MG tablet, Take 2 tablets by mouth daily., Disp: , Rfl:  .  furosemide (LASIX) 40 MG tablet, Take 1 tablet (40 mg total) by mouth daily., Disp: 90 tablet, Rfl: 1 .  hydroxychloroquine (PLAQUENIL) 200 MG tablet, Take 1 tablet by mouth 2 (two) times daily., Disp: , Rfl:  .  methotrexate 50 MG/2ML injection, Inject 0.8 mLs into the skin once a week., Disp: , Rfl:  .  Multiple Vitamins-Minerals (MULTI VITAMIN/MINERALS) TABS, Take by mouth., Disp: , Rfl:  .  NARCAN 4 MG/0.1ML LIQD nasal spray kit, , Disp: , Rfl:  .  nystatin (MYCOSTATIN) 100000 UNIT/ML suspension, Take 5 mLs (500,000 Units total) by mouth 4 (four) times daily., Disp: 60 mL, Rfl: 0 .  ondansetron (ZOFRAN) 4 MG tablet, Take 1 tablet (4 mg total) by mouth every 8 (eight) hours as needed for nausea or vomiting., Disp: 30 tablet, Rfl: 0 .  Oxycodone HCl 10 MG TABS, Take 1 tablet by mouth 5 (five) times daily., Disp: , Rfl:  .  pilocarpine (SALAGEN) 5 MG tablet, Take 1 tablet by mouth 3 (three) times daily. For dry mouth, Disp: , Rfl:  .  potassium chloride SA (K-DUR) 20 MEQ tablet, TAKE 1 TABLET TWICE A DAY, Disp: 180 tablet, Rfl: 3 .  pravastatin (PRAVACHOL) 40 MG tablet, TAKE 1 TABLET EVERY EVENING FOR CHOLESTEROL, Disp: 90 tablet, Rfl: 3 .  pregabalin (LYRICA) 50 MG capsule, Take 1 capsule (50 mg total) by mouth 3 (three) times daily., Disp: 90 capsule, Rfl: 0 .  ULORIC 40 MG tablet, Take 1 tablet by mouth daily., Disp: , Rfl:  .   Vitamin D, Ergocalciferol, (DRISDOL) 50000 units CAPS capsule, Take 1 capsule by mouth once a week., Disp: , Rfl:   Current Facility-Administered Medications:  .  cyanocobalamin ((VITAMIN B-12)) injection 1,000 mcg, 1,000 mcg, Intramuscular, Once, Steele Sizer, MD  Allergies  Allergen Reactions  . Robaxin [Methocarbamol] Nausea And Vomiting  . Tomato Hives  . Allopurinol Swelling    Patient-reported Patient-reported  . Gabapentin Other (See Comments) and Swelling  . Lyrica [Pregabalin]     angioedema  . Meperidine   . Tramadol     I personally reviewed active problem list, medication list, allergies, family history, social history, health maintenance with the patient/caregiver today.   ROS  Ten systems reviewed and is negative except as mentioned in HPI   Objective  Vitals:   01/13/19 1502  BP: 120/80  Pulse: 98  Resp: 16  Temp: 97.7 F (36.5 C)  TempSrc: Temporal  SpO2: 95%  Weight: 190 lb 6.4 oz (86.4 kg)  Height: 5' 2.75" (1.594 m)    Body mass index is 34 kg/m.  Physical Exam  Constitutional: Patient appears well-developed and well-nourished. Obese  No distress.  HEENT: head atraumatic, normocephalic, pupils equal and reactive to light Cardiovascular: Normal rate, regular rhythm and normal heart sounds.  No murmur heard. No BLE edema. Pulmonary/Chest: Effort normal and breath sounds normal. No respiratory distress. Abdominal: Soft.  There is no tenderness. Muscular Skeletal: using a cane, trigger points positive  Psychiatric: Patient has a normal mood and affect. behavior is normal. Judgment and thought content normal.  Recent Results (from the past 2160 hour(s))  HM DIABETES EYE EXAM     Status: None   Collection Time: 12/13/18 12:00 AM  Result Value Ref Range   HM Diabetic Eye Exam No Retinopathy No  Retinopathy  CULTURE, URINE COMPREHENSIVE     Status: Abnormal   Collection Time: 01/01/19 12:44 PM   Specimen: Urine   UR  Result Value Ref Range    Urine Culture, Comprehensive Final report (A)    Organism ID, Bacteria Escherichia coli (A)     Comment: Greater than 100,000 colony forming units per mL   Organism ID, Bacteria Comment (A)     Comment: Beta hemolytic Streptococcus, group B 50,000-100,000 colony forming units per mL Penicillin and ampicillin are drugs of choice for treatment of beta-hemolytic streptococcal infections. Susceptibility testing of penicillins and other beta-lactam agents approved by the FDA for treatment of beta-hemolytic streptococcal infections need not be performed routinely because nonsusceptible isolates are extremely rare in any beta-hemolytic streptococcus and have not been reported for Streptococcus pyogenes (group A). (CLSI)    ANTIMICROBIAL SUSCEPTIBILITY Comment     Comment:       ** S = Susceptible; I = Intermediate; R = Resistant **                    P = Positive; N = Negative             MICS are expressed in micrograms per mL    Antibiotic                 RSLT#1    RSLT#2    RSLT#3    RSLT#4 Amoxicillin/Clavulanic Acid    S Ampicillin                     R Cefepime                       S Ceftriaxone                    S Cefuroxime                     S Ciprofloxacin                  S Ertapenem                      S Gentamicin                     S Imipenem                       S Levofloxacin                   S Meropenem                      S Nitrofurantoin                 S Piperacillin/Tazobactam        S Tetracycline                   R Tobramycin                     S Trimethoprim/Sulfa             S      PHQ2/9: Depression screen [facility] 2/9 01/13/2019 01/13/2019 11/15/2018 10/08/2018 07/31/2018  Decreased Interest '1 1 2 2 1  ' Down, Depressed, Hopeless 0 '1 2 2 1  ' PHQ - 2 Score '1 2 4 4 2  ' Altered sleeping  '1 2 3 3 1  ' Tired, decreased energy '1 2 3 3 3  ' Change in appetite 0 1 1 0 3  Feeling bad or failure about yourself  0 '2 3 3 ' 0  Trouble concentrating '1 1 2 3 ' 0  Moving  slowly or fidgety/restless 0 '2 1 2 ' 0  Suicidal thoughts 0 0 0 0 0  PHQ-9 Score '4 12 17 18 9  ' Difficult doing work/chores Somewhat difficult Very difficult Very difficult Very difficult Somewhat difficult  Some recent data might be hidden    phq 9 is positive   Fall Risk: Fall Risk  01/13/2019 11/15/2018 10/08/2018 07/31/2018 05/14/2018  Falls in the past year? 0 0 '1 1 1  ' Number falls in past yr: 0 0 1 1 0  Injury with Fall? 0 0 0 0 0  Risk Factor Category  - - - - -  Risk for fall due to : - - History of fall(s) Orthopedic patient -  Risk for fall due to: Comment - - - Joint Pain -  Follow up - - Falls evaluation completed - Falls evaluation completed     Functional Status Survey: Is the patient deaf or have difficulty hearing?: No Does the patient have difficulty seeing, even when wearing glasses/contacts?: No Does the patient have difficulty concentrating, remembering, or making decisions?: No Does the patient have difficulty walking or climbing stairs?: Yes Does the patient have difficulty dressing or bathing?: No Does the patient have difficulty doing errands alone such as visiting a doctor's office or shopping?: No    Assessment & Plan  1. Abnormal urine odor  - CULTURE, URINE COMPREHENSIVE  2. Not currently working due to disabled status  Approved disability   3. B12 deficiency  - cyanocobalamin ((VITAMIN B-12)) injection 1,000 mcg

## 2019-01-15 LAB — CULTURE, URINE COMPREHENSIVE
MICRO NUMBER:: 1056023
RESULT:: NO GROWTH
SPECIMEN QUALITY:: ADEQUATE

## 2019-02-12 ENCOUNTER — Ambulatory Visit

## 2019-02-18 ENCOUNTER — Ambulatory Visit (INDEPENDENT_AMBULATORY_CARE_PROVIDER_SITE_OTHER)

## 2019-02-18 ENCOUNTER — Ambulatory Visit: Admitting: Family Medicine

## 2019-02-18 ENCOUNTER — Other Ambulatory Visit: Payer: Self-pay

## 2019-02-18 DIAGNOSIS — E538 Deficiency of other specified B group vitamins: Secondary | ICD-10-CM | POA: Diagnosis not present

## 2019-02-18 MED ORDER — CYANOCOBALAMIN 1000 MCG/ML IJ SOLN
1000.0000 ug | Freq: Once | INTRAMUSCULAR | Status: AC
  Administered 2019-02-18: 1000 ug via INTRAMUSCULAR

## 2019-02-18 NOTE — Progress Notes (Signed)
Patient came in for her B-12 injection. She tolerated it well. NKDA.   

## 2019-03-24 ENCOUNTER — Other Ambulatory Visit: Payer: Self-pay

## 2019-03-24 ENCOUNTER — Ambulatory Visit (INDEPENDENT_AMBULATORY_CARE_PROVIDER_SITE_OTHER)

## 2019-03-24 DIAGNOSIS — E538 Deficiency of other specified B group vitamins: Secondary | ICD-10-CM | POA: Diagnosis not present

## 2019-03-24 MED ORDER — CYANOCOBALAMIN 1000 MCG/ML IJ SOLN
1000.0000 ug | Freq: Once | INTRAMUSCULAR | Status: AC
  Administered 2019-03-24: 1000 ug via INTRAMUSCULAR

## 2019-03-24 NOTE — Progress Notes (Signed)
Patient came in for her B-12 injection. She tolerated it well. NKDA.   

## 2019-04-09 ENCOUNTER — Telehealth: Payer: Self-pay | Admitting: Family Medicine

## 2019-04-09 NOTE — Telephone Encounter (Signed)
Copied from CRM [id number]. Topic: General - Other >> Apr 08, 2019  4:24 PM Daphine Deutscher D wrote: Reason for CRM:  Dr. Farris Has a rheumatologist called on behave of [employer] for disability claim for this patient and would like Dr. Carlynn Purl to return her call DR. Kramer's #  [phone]   Spoke to Dr. Farris Has. Explained that depression got worse when she lost her job. She is always in pain from arthralgias and also FMS

## 2019-04-15 ENCOUNTER — Encounter: Payer: Self-pay | Admitting: Family Medicine

## 2019-04-15 ENCOUNTER — Ambulatory Visit (INDEPENDENT_AMBULATORY_CARE_PROVIDER_SITE_OTHER): Admitting: Family Medicine

## 2019-04-15 ENCOUNTER — Other Ambulatory Visit: Payer: Self-pay

## 2019-04-15 VITALS — BP 120/70 | HR 99 | Temp 96.6°F | Resp 16 | Ht 62.75 in | Wt 191.0 lb

## 2019-04-15 DIAGNOSIS — F331 Major depressive disorder, recurrent, moderate: Secondary | ICD-10-CM

## 2019-04-15 DIAGNOSIS — R5382 Chronic fatigue, unspecified: Secondary | ICD-10-CM | POA: Diagnosis not present

## 2019-04-15 DIAGNOSIS — K219 Gastro-esophageal reflux disease without esophagitis: Secondary | ICD-10-CM

## 2019-04-15 DIAGNOSIS — M3503 Sicca syndrome with myopathy: Secondary | ICD-10-CM

## 2019-04-15 DIAGNOSIS — E559 Vitamin D deficiency, unspecified: Secondary | ICD-10-CM

## 2019-04-15 DIAGNOSIS — E785 Hyperlipidemia, unspecified: Secondary | ICD-10-CM

## 2019-04-15 DIAGNOSIS — R7303 Prediabetes: Secondary | ICD-10-CM

## 2019-04-15 DIAGNOSIS — D8989 Other specified disorders involving the immune mechanism, not elsewhere classified: Secondary | ICD-10-CM

## 2019-04-15 DIAGNOSIS — J3489 Other specified disorders of nose and nasal sinuses: Secondary | ICD-10-CM

## 2019-04-15 DIAGNOSIS — G894 Chronic pain syndrome: Secondary | ICD-10-CM

## 2019-04-15 DIAGNOSIS — B370 Candidal stomatitis: Secondary | ICD-10-CM

## 2019-04-15 DIAGNOSIS — M797 Fibromyalgia: Secondary | ICD-10-CM | POA: Diagnosis not present

## 2019-04-15 MED ORDER — FLUOXETINE HCL 40 MG PO CAPS: 40.0000 mg | ORAL_CAPSULE | Freq: Every day | ORAL | 1 refills | Status: DC

## 2019-04-15 MED ORDER — TRULICITY 1.5 MG/0.5ML ~~LOC~~ SOAJ: 1.5000 mg | SUBCUTANEOUS | 1 refills | Status: AC

## 2019-04-15 MED ORDER — FAMOTIDINE 40 MG PO TABS: 40.0000 mg | ORAL_TABLET | Freq: Every day | ORAL | 1 refills | Status: AC

## 2019-04-15 MED ORDER — FLUCONAZOLE 150 MG PO TABS: 150.0000 mg | ORAL_TABLET | ORAL | 0 refills | Status: DC

## 2019-04-15 NOTE — Progress Notes (Signed)
Name: Tracy [name]   MRN: [id number]    DOB: 07-24-1970   Date:04/15/2019       Progress Note  Subjective  Chief Complaint  Chief Complaint  Patient presents with  . Allergic Rhinitis   . Depression  . Fibromyalgia  . Sjogren's syndrome  . Sinusitis    She thinks she has a sinus infection. She has been experiencing headaches, nasal congestion, cough, drainage with yellow mucos x 1 week.    HPI  Sinus Pressure:   she states symptoms started over one week ago, initially have facial pressure, followed by post-nasal drainage, nasal congestion, waking up with a cough , sputum is yellow. No fever or chills, change on sense of smell or taste. Denis SOB or wheezing. She was tested for COVID-19 four days after beginning of symptoms and it was negative. She states today she is feeling a little worse than one week ago.   Sjogrens:   Sees Rheumatologist- Dr. [facility] still has myalgias, dry mouth, dry eyes and daily fatigue-She gets Adderal from Dr. Alroy Dust and it helps with fatigue. Currently on Methotrexate and plaquenil for symptomsstill has daily pain , it is about 6/10 at this time, pain gets as high as 10/10. Symptoms seems to get worse with activity, but also not switching positions for a long time , cold weather  Failed back syndrome and chronic pain   She had decompression lumbar spine Dec 2019, symptoms improved for a couple of weeks but got gradually worse again, symptoms as severe as before her surgery. She still seeing pain clinic. She has intermittent radiculitis on right lower leg, however recently also noticing numbness and tingling in the middle of left left ( right above and below the knee) No bowel or bladder incontinence.   Obesity  Insurance does not cover any weight loss medications. Patient states she has tried metformin but was having uncontrollable diarrhea, she was on trulicity but stopped because of LUQ pain, however lipase was negative, and  once pain clinic changed from methocarbamol to flexeril the pain resolved. She resumed Trulicity without problems, she ran out of medication two weeks ago and weight has gone up a little since last visit.   FMS/Connective Tissue Disorder  she is always in pain, [facility],she also has recurrent cervical lymphadenopathy and periods of sore throat,fever.She is off Duloxetine and feels a little worse . She tried Lyrica but had an adverse reaction. She was on gabapentin but it caused mental fogginess and she stopped medication. Pain today is 6/10  Pre-diabetes  she denies polyphagia, nopolydipsia, but sips on water because of Sjogren's, she has polyuria from lasix.She could not tolerate Metformin, she states it increased her appetite and caused weight gain..Last hgbA1C went down from 6.1 % to 5.6 % . She is on Trulicity at this time  DyslipidemiaGERD  she is taking Pravastatin,still has daily muscles aches from [place]. LDL is at goal, discussed ways to bring triglycerides down. Unchanged   Major Depression  She has been approved for social security disability. She was feeling very bad when she could not help out at home financially and having the social security has lift a little of her stress. She has a  long history of depression, since her 61's. She has taken alprazolam for anxiety in the past , tried Wellbutrin and Prozac. She was switched to duloxetine because of neuropathy. She has been back on on Prozac since March 2020  ,  dose increased in July when her symptoms of depression got worse. Phq 9 today is 9, up from 4. She states feeling a little worse this time because of winter , not sleeping well and makes her feel worse   GERD:   She states doing better with medication  Right hip pain:  Seen by Dr. Rudene Christians and needs labral repair and femuroplasty and acetabuloplasty.    Recurrent yeast infections  She has  recurrent candidiasis on her mouth or yeast vaginitis, she uses nystatin or diflucan prn    Patient Active Problem List   Diagnosis Date Noted  . Prediabetes 05/14/2018  . Moderate episode of recurrent major depressive disorder ([place]) 05/22/2017  . Elevated uric acid in blood 04/28/2016  . Degenerative disc disease, lumbar 05/26/2015  . Lumbar herniated disc 05/26/2015  . Connective tissue disorder ([place]) 12/09/2014  . 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 Fibromyalgia 12/09/2014  . Abnormal ECG 12/08/2014  . Abnormal antinuclear antibody titer 12/08/2014  . Major depression, chronic 12/08/2014  . Edema leg 12/08/2014  . 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 CFIDS (chronic fatigue and immune dysfunction syndrome) ([place]) 12/08/2014  . 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 Dyslipidemia 12/08/2014  . Family history of breast cancer 12/08/2014  . H/O: hysterectomy 12/08/2014  . Dysmetabolic syndrome 86/16/8372  . Obesity (BMI 30-39.9) 12/08/2014  . Disorder of tendon 12/08/2014  . Sjogren's syndrome ([place]) 12/08/2014  . Allergic rhinitis, seasonal 12/08/2014  . Snores 12/08/2014  . Tenosynovitis of thumb 12/02/2014    Past Surgical History:  Procedure Laterality Date  . ABDOMINAL HYSTERECTOMY    . BREAST REDUCTION SURGERY Bilateral 2000   [facility]  . CARPAL TUNNEL RELEASE Right 12/16/2015  . CARPAL TUNNEL RELEASE Left 04/18/2016   also left thumb replacement  . CHOLECYSTECTOMY    . ENDOMETRIAL ABLATION     ovarian cyst removal  . JOINT REPLACEMENT Right 12/16/2015   right thumb  . LUMBAR LAMINECTOMY  02/19/2018  . NASAL SINUS SURGERY    . REDUCTION MAMMAPLASTY  2000   september  . TUBAL LIGATION      Family History  Problem Relation Age of Onset  . Hypertension Mother   . Allergic rhinitis Mother   . Hypertension Father   . Alcohol abuse Father   . Heart disease Father   . Alcohol abuse Brother   . Hypertension Brother   . Breast cancer Maternal Aunt 30       two  aunts  . Breast cancer Paternal Aunt   . Breast cancer Maternal Grandmother   . Breast cancer Cousin 71       paternal cousin     Current Outpatient Medications:  .  amphetamine-dextroamphetamine (ADDERALL) 10 MG tablet, Take 1-20 tablets by mouth 2 (two) times daily., Disp: , Rfl:  .  aspirin 81 MG tablet, Take by mouth., Disp: , Rfl:  .  Biotin (BIOTIN 5000) 5 MG CAPS, Take by mouth., Disp: , Rfl:  .  Cyanocobalamin (B-12) 1000 MCG SUBL, Place 1 tablet under the tongue 3 (three) times a week., Disp: 48 each, Rfl: 1 .  cyclobenzaprine (FLEXERIL) 5 MG tablet, Take 10 mg by mouth at bedtime. , Disp: , Rfl: 0 .  cycloSPORINE (RESTASIS) 0.05 % ophthalmic emulsion, Place 1 drop into both eyes 2 (two) times daily., Disp: , Rfl:  .  Dulaglutide (TRULICITY) 1.5 BM/2.1JD SOPN, Inject 1.5 mg into the skin once a week., Disp: 12 pen, Rfl: 1 .  estradiol (ESTRACE) 0.5 MG tablet, TAKE  1 TABLET DAILY, Disp: 90 tablet, Rfl: 3 .  famotidine (PEPCID) 40 MG tablet, Take 1 tablet (40 mg total) by mouth daily., Disp: 90 tablet, Rfl: 1 .  fluconazole (DIFLUCAN) 150 MG tablet, Take 1 tablet (150 mg total) by mouth every other day. For 3 days prn, Disp: 12 tablet, Rfl: 0 .  FLUoxetine (PROZAC) 40 MG capsule, Take 1 capsule (40 mg total) by mouth daily., Disp: 90 capsule, Rfl: 0 .  folic acid (FOLVITE) 1 MG tablet, Take 2 tablets by mouth daily., Disp: , Rfl:  .  furosemide (LASIX) 40 MG tablet, Take 1 tablet (40 mg total) by mouth daily., Disp: 90 tablet, Rfl: 1 .  hydroxychloroquine (PLAQUENIL) 200 MG tablet, Take 1 tablet by mouth 2 (two) times daily., Disp: , Rfl:  .  methotrexate 50 MG/2ML injection, Inject 0.8 mLs into the skin once a week., Disp: , Rfl:  .  Multiple Vitamins-Minerals (MULTI VITAMIN/MINERALS) TABS, Take by mouth., Disp: , Rfl:  .  NARCAN 4 MG/0.1ML LIQD nasal spray kit, , Disp: , Rfl:  .  nystatin (MYCOSTATIN) 100000 UNIT/ML suspension, Take 5 mLs (500,000 Units total) by mouth 4 (four)  times daily., Disp: 60 mL, Rfl: 0 .  ondansetron (ZOFRAN) 4 MG tablet, Take 1 tablet (4 mg total) by mouth every 8 (eight) hours as needed for nausea or vomiting., Disp: 30 tablet, Rfl: 0 .  Oxycodone HCl 10 MG TABS, Take 1 tablet by mouth 5 (five) times daily., Disp: , Rfl:  .  pilocarpine (SALAGEN) 5 MG tablet, Take 1 tablet by mouth 3 (three) times daily. For dry mouth, Disp: , Rfl:  .  potassium chloride SA (K-DUR) 20 MEQ tablet, TAKE 1 TABLET TWICE A DAY, Disp: 180 tablet, Rfl: 3 .  pravastatin (PRAVACHOL) 40 MG tablet, TAKE 1 TABLET EVERY EVENING FOR CHOLESTEROL, Disp: 90 tablet, Rfl: 3 .  ULORIC 40 MG tablet, Take 1 tablet by mouth daily., Disp: , Rfl:  .  Vitamin D, Ergocalciferol, (DRISDOL) 50000 units CAPS capsule, Take 1 capsule by mouth once a week., Disp: , Rfl:  .  amitriptyline (ELAVIL) 25 MG tablet, Take 1 tablet by mouth every evening., Disp: , Rfl:  .  betamethasone dipropionate (DIPROLENE) 0.05 % cream, Apply 1 g topically daily as needed., Disp: , Rfl:  .  pregabalin (LYRICA) 50 MG capsule, Take 1 capsule (50 mg total) by mouth 3 (three) times daily. (Patient not taking: Reported on 04/15/2019), Disp: 90 capsule, Rfl: 0  Allergies  Allergen Reactions  . Robaxin [Methocarbamol] Nausea And Vomiting  . Tomato Hives  . Allopurinol Swelling    Patient-reported Patient-reported  . Gabapentin Other (See Comments) and Swelling  . Lyrica [Pregabalin]     angioedema  . Meperidine   . Tramadol     I personally reviewed active problem list, medication list, allergies, family history, social history, health maintenance with the patient/caregiver today.   ROS  Ten systems reviewed and is negative except as mentioned in HPI   Objective  Vitals:   04/15/19 1419  BP: 120/70  Pulse: 99  Resp: 16  Temp: (!) 96.6 F (35.9 C)  TempSrc: Temporal  SpO2: 96%  Weight: 191 lb (86.6 kg)  Height: 5' 2.75" (1.594 m)    Body mass index is 34.1 kg/m.  Physical  Exam  Constitutional: Patient appears well-developed and well-nourished. Obese  No distress.  HEENT: head atraumatic, normocephalic, pupils equal and reactive to light, tender during percussion on maxillary sinus, no post-nasal drainage, turbinates  are slightly red Cardiovascular: Normal rate, regular rhythm and normal heart sounds.  No murmur heard. No BLE edema. Pulmonary/Chest: Effort normal and breath sounds normal. No respiratory distress. Abdominal: Soft.  There is no tenderness. Muscular skeletal: trigger points positive  Psychiatric: Patient has a normal mood and affect. behavior is normal. Judgment and thought content normal.   PHQ2/9: Depression screen [facility] 2/9 04/15/2019 01/13/2019 01/13/2019 11/15/2018 10/08/2018  Decreased Interest 0 '1 1 2 2  ' Down, Depressed, Hopeless 1 0 '1 2 2  ' PHQ - 2 Score '1 1 2 4 4  ' Altered sleeping '2 1 2 3 3  ' Tired, decreased energy '3 1 2 3 3  ' Change in appetite 0 0 1 1 0  Feeling bad or failure about yourself  2 0 '2 3 3  ' Trouble concentrating '1 1 1 2 3  ' Moving slowly or fidgety/restless 0 0 '2 1 2  ' Suicidal thoughts 0 0 0 0 0  PHQ-9 Score '9 4 12 17 18  ' Difficult doing work/chores Not difficult at all Somewhat difficult Very difficult Very difficult Very difficult  Some recent data might be hidden    phq 9 is positive   Fall Risk: Fall Risk  04/15/2019 01/13/2019 11/15/2018 10/08/2018 07/31/2018  Falls in the past year? 0 0 0 1 1  Number falls in past yr: 0 0 0 1 1  Injury with Fall? 0 0 0 0 0  Risk Factor Category  - - - - -  Risk for fall due to : - - - History of fall(s) Orthopedic patient  Risk for fall due to: Comment - - - - Joint Pain  Follow up - - - Falls evaluation completed -     Functional Status Survey: Is the patient deaf or have difficulty hearing?: No Does the patient have difficulty seeing, even when wearing glasses/contacts?: No Does the patient have difficulty concentrating, remembering, or making decisions?: Yes Does the patient  have difficulty walking or climbing stairs?: Yes Does the patient have difficulty dressing or bathing?: Yes Does the patient have difficulty doing errands alone such as visiting a doctor's office or shopping?: No    Assessment & Plan  1. GERD without esophagitis  - famotidine (PEPCID) 40 MG tablet; Take 1 tablet (40 mg total) by mouth daily.  Dispense: 90 tablet; Refill: 1  2. CFIDS (chronic fatigue and immune dysfunction syndrome) ([facility])   3. Sjogren's syndrome with myopathy ([place])  She asked for pilocarpine, she will contact rheumatologist about that   4. Fibromyalgia  stable  5. Moderate episode of recurrent major depressive disorder ([facility])  - FLUoxetine (PROZAC) 40 MG capsule; Take 1 capsule (40 mg total) by mouth daily.  Dispense: 90 capsule; Refill: 1 Discussed importance of adjusting medication but she states she is fine  6. Chronic pain syndrome  Keep follow up with pain clinic  7. Vitamin D deficiency  Taking otc supplementation   8. Dyslipidemia  On pravastatin   9. Sinus pressure  Advised saline spray, fluids  10. Oral candidiasis  - fluconazole (DIFLUCAN) 150 MG tablet; Take 1 tablet (150 mg total) by mouth every other day. For 3 days prn  Dispense: 12 tablet; Refill: 0  11. Prediabetes  - Dulaglutide (TRULICITY) 1.5 OE/7.0JJ SOPN; Inject 1.5 mg into the skin once a week.  Dispense: 12 pen; Refill: 1

## 2019-05-15 ENCOUNTER — Telehealth: Payer: Self-pay | Admitting: Family Medicine

## 2019-05-15 DIAGNOSIS — B370 Candidal stomatitis: Secondary | ICD-10-CM

## 2019-05-15 DIAGNOSIS — R05 Cough: Secondary | ICD-10-CM

## 2019-05-15 DIAGNOSIS — R059 Cough, unspecified: Secondary | ICD-10-CM

## 2019-05-15 NOTE — Telephone Encounter (Signed)
Request refills for diflucan 150 mg and Anoro Ellipta. The Anoro was discontinued on 07/31/18 and the diflucan looks to be for a short period of time for 12 tablets.

## 2019-05-19 NOTE — Telephone Encounter (Signed)
Pt states she is moving to [place] in May but if for any reason she is still here she will call and schedule

## 2019-06-02 ENCOUNTER — Telehealth: Payer: Self-pay

## 2019-06-02 NOTE — Telephone Encounter (Signed)
Copied from CRM [id number]. Topic: General - Other >> Jun 02, 2019 10:51 AM Jaquita Rector A wrote: Reason for CRM: Patient called to inform Dr Carlynn Purl that she was seen by her Rheumatologist and was informed to see her PCP. Asking for a call back at  Ph# [phone] if she can get in due to there being no appointments before 06/17/19. Please advise

## 2019-06-03 NOTE — Telephone Encounter (Signed)
Spoke with patient she seen her Rheumatologist Dr. Clovis Riley at [facility] and they did her normal blood work-  Her hematocrits, CBC-RBC, and Monocytes all came back high. Patient states she has chronic fatigue syndrome and is not anymore fatigue than normal and she is packing up to move. However, she has been fatigue and achy, since all this blood work came back abnormal Dr. Clovis Riley informed the patient to call her PCP. She is unsure if it is a flair up of EBV again or what. Please advise.

## 2019-06-04 NOTE — Telephone Encounter (Signed)
appt sch'd with Dr Carlynn Purl for this friday

## 2019-06-06 ENCOUNTER — Encounter: Payer: Self-pay | Admitting: Family Medicine

## 2019-06-06 ENCOUNTER — Other Ambulatory Visit: Payer: Self-pay

## 2019-06-06 ENCOUNTER — Ambulatory Visit (INDEPENDENT_AMBULATORY_CARE_PROVIDER_SITE_OTHER): Admitting: Family Medicine

## 2019-06-06 VITALS — BP 100/70 | HR 104 | Temp 96.2°F | Resp 16 | Ht 62.75 in | Wt 187.5 lb

## 2019-06-06 DIAGNOSIS — R718 Other abnormality of red blood cells: Secondary | ICD-10-CM | POA: Diagnosis not present

## 2019-06-06 DIAGNOSIS — D72823 Leukemoid reaction: Secondary | ICD-10-CM | POA: Diagnosis not present

## 2019-06-06 LAB — CBC WITH DIFFERENTIAL/PLATELET
Absolute Monocytes: 446 cells/uL (ref 200–950)
Basophils Absolute: 50 cells/uL (ref 0–200)
Basophils Relative: 0.9 %
Eosinophils Absolute: 121 cells/uL (ref 15–500)
Eosinophils Relative: 2.2 %
HCT: 45.4 % — ABNORMAL HIGH (ref 35.0–45.0)
Hemoglobin: 15.1 g/dL (ref 11.7–15.5)
Lymphs Abs: 1898 cells/uL (ref 850–3900)
MCH: 29.2 pg (ref 27.0–33.0)
MCHC: 33.3 g/dL (ref 32.0–36.0)
MCV: 87.6 fL (ref 80.0–100.0)
MPV: 9.6 fL (ref 7.5–12.5)
Monocytes Relative: 8.1 %
Neutro Abs: 2987 cells/uL (ref 1500–7800)
Neutrophils Relative %: 54.3 %
Platelets: 268 10*3/uL (ref 140–400)
RBC: 5.18 10*6/uL — ABNORMAL HIGH (ref 3.80–5.10)
RDW: 13.2 % (ref 11.0–15.0)
Total Lymphocyte: 34.5 %
WBC: 5.5 10*3/uL (ref 3.8–10.8)

## 2019-06-06 NOTE — Progress Notes (Signed)
Name: Tracy [name]   MRN: [id number]    DOB: July 11, 1970   Date:06/06/2019       Progress Note  Subjective  Chief Complaint  Chief Complaint  Patient presents with  . Results    Abn CBC x 2. Recommended to follow up with PCP.    HPI  Abnormal labs: she was seen by Rheumatologist in the beginning of March and Hemoglobin was 16.8 ( elevated ) and also HCT was 48.9 ( normal 34-46.6), she went back to have it repeated on 05/30/2019 and WBC was high at 11.7. hemoglobin 16 HCT 47.9, neutrophyls elevated at 8.6 and also monocytes slightly up.  She has joint pains and swelling that is chronic and stable. No rashes at this time, no headaches , she has urinary frequency and hesitancy but per patient going on for months and negative study at Rheumatologist  She is moving next week and has been packing so she is more tired, but otherwise she is doing fine and not sure what is causing the CBC abnormality.   Explained if HCT remains high she will need a sleep study, we will recheck CBC today and hopefully white count will be better, no sources of infection based on no symptoms.    Patient Active Problem List   Diagnosis Date Noted  . Prediabetes 05/14/2018  . Moderate episode of recurrent major depressive disorder ([place]) 05/22/2017  . Elevated uric acid in blood 04/28/2016  . Degenerative disc disease, lumbar 05/26/2015  . Lumbar herniated disc 05/26/2015  . Connective tissue disorder ([place]) 12/09/2014  . 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 Fibromyalgia 12/09/2014  . Abnormal ECG 12/08/2014  . Abnormal antinuclear antibody titer 12/08/2014  . Major depression, chronic 12/08/2014  . Edema leg 12/08/2014  . 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 CFIDS (chronic fatigue and immune dysfunction syndrome) ([place]) 12/08/2014  . 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 Dyslipidemia 12/08/2014  . Family history of breast cancer 12/08/2014  . H/O: hysterectomy 12/08/2014  . Dysmetabolic syndrome 08/65/7846   . Obesity (BMI 30-39.9) 12/08/2014  . Disorder of tendon 12/08/2014  . Sjogren's syndrome ([place]) 12/08/2014  . Allergic rhinitis, seasonal 12/08/2014  . Snores 12/08/2014  . Tenosynovitis of thumb 12/02/2014    Past Surgical History:  Procedure Laterality Date  . ABDOMINAL HYSTERECTOMY    . BREAST REDUCTION SURGERY Bilateral 2000   [facility]  . CARPAL TUNNEL RELEASE Right 12/16/2015  . CARPAL TUNNEL RELEASE Left 04/18/2016   also left thumb replacement  . CHOLECYSTECTOMY    . ENDOMETRIAL ABLATION     ovarian cyst removal  . JOINT REPLACEMENT Right 12/16/2015   right thumb  . LUMBAR LAMINECTOMY  02/19/2018  . NASAL SINUS SURGERY    . REDUCTION MAMMAPLASTY  2000   september  . TUBAL LIGATION      Family History  Problem Relation Age of Onset  . Hypertension Mother   . Allergic rhinitis Mother   . Hypertension Father   . Alcohol abuse Father   . Heart disease Father   . Alcohol abuse Brother   . Hypertension Brother   . Breast cancer Maternal Aunt 30       two aunts  . Breast cancer Paternal Aunt   . Breast cancer Maternal Grandmother   . Breast cancer Cousin 58       paternal cousin    Social History   Tobacco Use  . Smoking status: Former Smoker    Packs/day: 0.75    Years: 28.00  Pack years: 21.00    Types: Cigarettes    Start date: 12/08/1988    Quit date: 12/11/2016    Years since quitting: 2.4  . Smokeless tobacco: Never Used  Substance Use Topics  . Alcohol use: No    Alcohol/week: 0.0 standard drinks     Current Outpatient Medications:  .  amitriptyline (ELAVIL) 25 MG tablet, Take 1 tablet by mouth every evening., Disp: , Rfl:  .  amphetamine-dextroamphetamine (ADDERALL) 10 MG tablet, Take 1-20 tablets by mouth 2 (two) times daily., Disp: , Rfl:  .  ANORO ELLIPTA 62.5-25 MCG/INH AEPB, USE 1 INHALATION DAILY, Disp: 180 each, Rfl: 3 .  aspirin 81 MG tablet, Take by mouth., Disp: , Rfl:  .  betamethasone dipropionate (DIPROLENE) 0.05 % cream,  Apply 1 g topically daily as needed., Disp: , Rfl:  .  Biotin (BIOTIN 5000) 5 MG CAPS, Take by mouth., Disp: , Rfl:  .  Cyanocobalamin (B-12) 1000 MCG SUBL, Place 1 tablet under the tongue 3 (three) times a week., Disp: 48 each, Rfl: 1 .  cycloSPORINE (RESTASIS) 0.05 % ophthalmic emulsion, Place 1 drop into both eyes 2 (two) times daily., Disp: , Rfl:  .  Dulaglutide (TRULICITY) 1.5 ZO/1.0RU SOPN, Inject 1.5 mg into the skin once a week., Disp: 12 pen, Rfl: 1 .  estradiol (ESTRACE) 0.5 MG tablet, TAKE 1 TABLET DAILY, Disp: 90 tablet, Rfl: 3 .  famotidine (PEPCID) 40 MG tablet, Take 1 tablet (40 mg total) by mouth daily., Disp: 90 tablet, Rfl: 1 .  fluconazole (DIFLUCAN) 150 MG tablet, TAKE 1 TABLET EVERY OTHER DAY FOR 3 DAYS AS NEEDED, Disp: 12 tablet, Rfl: 11 .  FLUoxetine (PROZAC) 40 MG capsule, Take 1 capsule (40 mg total) by mouth daily., Disp: 90 capsule, Rfl: 1 .  folic acid (FOLVITE) 1 MG tablet, Take 2 tablets by mouth daily., Disp: , Rfl:  .  furosemide (LASIX) 40 MG tablet, Take 1 tablet (40 mg total) by mouth daily., Disp: 90 tablet, Rfl: 1 .  hydroxychloroquine (PLAQUENIL) 200 MG tablet, Take 1 tablet by mouth 2 (two) times daily., Disp: , Rfl:  .  methotrexate 50 MG/2ML injection, Inject 0.8 mLs into the skin once a week., Disp: , Rfl:  .  Multiple Vitamins-Minerals (MULTI VITAMIN/MINERALS) TABS, Take by mouth., Disp: , Rfl:  .  NARCAN 4 MG/0.1ML LIQD nasal spray kit, , Disp: , Rfl:  .  nystatin (MYCOSTATIN) 100000 UNIT/ML suspension, Take 5 mLs (500,000 Units total) by mouth 4 (four) times daily., Disp: 60 mL, Rfl: 0 .  Oxycodone HCl 10 MG TABS, Take 1 tablet by mouth 5 (five) times daily., Disp: , Rfl:  .  pilocarpine (SALAGEN) 5 MG tablet, Take 1 tablet by mouth 3 (three) times daily. For dry mouth, Disp: , Rfl:  .  potassium chloride SA (K-DUR) 20 MEQ tablet, TAKE 1 TABLET TWICE A DAY, Disp: 180 tablet, Rfl: 3 .  pravastatin (PRAVACHOL) 40 MG tablet, TAKE 1 TABLET EVERY EVENING  FOR CHOLESTEROL, Disp: 90 tablet, Rfl: 3 .  pregabalin (LYRICA) 50 MG capsule, Take 1 capsule (50 mg total) by mouth 3 (three) times daily., Disp: 90 capsule, Rfl: 0 .  ULORIC 40 MG tablet, Take 1 tablet by mouth daily., Disp: , Rfl:  .  Vitamin D, Ergocalciferol, (DRISDOL) 50000 units CAPS capsule, Take 1 capsule by mouth once a week., Disp: , Rfl:  .  cyclobenzaprine (FLEXERIL) 10 MG tablet, Take 10 mg by mouth 3 (three) times daily., Disp: , Rfl:  .  doxycycline (VIBRAMYCIN) 50 MG capsule, Take 50 mg by mouth daily., Disp: , Rfl:  .  metroNIDAZOLE (METROCREAM) 0.75 % cream, APPLY EXTERNALLY TO THE AFFECTED AREA EVERY DAY, Disp: , Rfl:  .  SYMPROIC 0.2 MG TABS, Take 1 tablet by mouth daily as needed., Disp: , Rfl:   Allergies  Allergen Reactions  . Robaxin [Methocarbamol] Nausea And Vomiting  . Tomato Hives  . Allopurinol Swelling    Patient-reported Patient-reported  . Gabapentin Other (See Comments) and Swelling  . Lyrica [Pregabalin]     angioedema  . Meperidine   . Tramadol     I personally reviewed active problem list, medication list, allergies, family history, social history, health maintenance with the patient/caregiver today.   ROS  Constitutional: Negative for fever or weight change.  Respiratory: Negative for cough and shortness of breath.   Cardiovascular: Negative for chest pain or palpitations.  Gastrointestinal: Negative for abdominal pain, no bowel changes.  Musculoskeletal: Negative for gait problem but has  joint swelling - wrist/hands/knees and ankles.  Skin: Negative for rash.  Neurological: Negative for dizziness or headache.  No other specific complaints in a complete review of systems (except as listed in HPI above).  Objective  Vitals:   06/06/19 0831  BP: 100/70  Pulse: (!) 104  Resp: 16  Temp: (!) 96.2 F (35.7 C)  TempSrc: Temporal  SpO2: 98%  Weight: 187 lb 8 oz (85 kg)  Height: 5' 2.75" (1.594 m)    Body mass index is 33.48  kg/m.  Physical Exam  Constitutional: Patient appears well-developed and well-nourished. Obese  No distress.  HEENT: head atraumatic, normocephalic, pupils equal and reactive to light Cardiovascular: Normal rate, regular rhythm and normal heart sounds.  No murmur heard. No BLE edema. Pulmonary/Chest: Effort normal and breath sounds normal. No respiratory distress. Abdominal: Soft.  There is no tenderness. Psychiatric: Patient has a normal mood and affect. behavior is normal. Judgment and thought content normal.  PHQ2/9: Depression screen [facility] 2/9 06/06/2019 04/15/2019 01/13/2019 01/13/2019 11/15/2018  Decreased Interest 0 0 '1 1 2  ' Down, Depressed, Hopeless 0 1 0 1 2  PHQ - 2 Score 0 '1 1 2 4  ' Altered sleeping '1 2 1 2 3  ' Tired, decreased energy '2 3 1 2 3  ' Change in appetite 0 0 0 1 1  Feeling bad or failure about yourself  0 2 0 2 3  Trouble concentrating '1 1 1 1 2  ' Moving slowly or fidgety/restless 0 0 0 2 1  Suicidal thoughts 0 0 0 0 0  PHQ-9 Score '4 9 4 12 17  ' Difficult doing work/chores Somewhat difficult Not difficult at all Somewhat difficult Very difficult Very difficult  Some recent data might be hidden    phq 9 is positive   Fall Risk: Fall Risk  06/06/2019 04/15/2019 01/13/2019 11/15/2018 10/08/2018  Falls in the past year? 0 0 0 0 1  Number falls in past yr: 0 0 0 0 1  Injury with Fall? 0 0 0 0 0  Risk Factor Category  - - - - -  Risk for fall due to : - - - - History of fall(s)  Risk for fall due to: Comment - - - - -  Follow up - - - - Falls evaluation completed     Functional Status Survey: Is the patient deaf or have difficulty hearing?: No Does the patient have difficulty seeing, even when wearing glasses/contacts?: No Does the patient have difficulty concentrating, remembering, or  making decisions?: Yes Does the patient have difficulty walking or climbing stairs?: Yes Does the patient have difficulty dressing or bathing?: No Does the patient have difficulty doing  errands alone such as visiting a doctor's office or shopping?: No   Assessment & Plan  1. Elevated red blood cell count  - CBC with Differential/Platelet  2. Leukemoid reaction  - CBC with Differential/Platelet

## 2019-06-09 ENCOUNTER — Encounter: Payer: Self-pay | Admitting: Family Medicine

## 2019-08-01 ENCOUNTER — Encounter: Admit: 2019-08-01 | Discharge: 2019-08-01

## 2019-10-27 ENCOUNTER — Other Ambulatory Visit: Payer: Self-pay | Admitting: Family Medicine

## 2019-10-27 DIAGNOSIS — R109 Unspecified abdominal pain: Secondary | ICD-10-CM

## 2019-11-13 ENCOUNTER — Other Ambulatory Visit: Payer: Self-pay | Admitting: Family Medicine

## 2019-11-13 DIAGNOSIS — F331 Major depressive disorder, recurrent, moderate: Secondary | ICD-10-CM

## 2019-11-24 ENCOUNTER — Encounter: Admit: 2019-11-24 | Discharge: 2019-11-24

## 2019-11-24 ENCOUNTER — Ambulatory Visit: Admit: 2019-11-24 | Discharge: 2019-11-25

## 2019-11-24 DIAGNOSIS — F909 Attention-deficit hyperactivity disorder, unspecified type: Secondary | ICD-10-CM

## 2019-11-24 DIAGNOSIS — F1190 Opioid use, unspecified, uncomplicated: Secondary | ICD-10-CM

## 2019-11-24 DIAGNOSIS — M542 Cervicalgia: Secondary | ICD-10-CM

## 2019-11-24 DIAGNOSIS — M5020 Other cervical disc displacement, unspecified cervical region: Secondary | ICD-10-CM

## 2019-11-24 DIAGNOSIS — M47816 Spondylosis without myelopathy or radiculopathy, lumbar region: Secondary | ICD-10-CM

## 2019-11-24 DIAGNOSIS — G8929 Other chronic pain: Secondary | ICD-10-CM

## 2019-11-24 DIAGNOSIS — M4802 Spinal stenosis, cervical region: Secondary | ICD-10-CM

## 2019-11-24 DIAGNOSIS — M3500 Sicca syndrome, unspecified: Secondary | ICD-10-CM

## 2019-11-24 DIAGNOSIS — M5136 Other intervertebral disc degeneration, lumbar region: Secondary | ICD-10-CM

## 2019-11-24 DIAGNOSIS — E785 Hyperlipidemia, unspecified: Secondary | ICD-10-CM

## 2019-11-24 DIAGNOSIS — I7090 Unspecified atherosclerosis: Secondary | ICD-10-CM

## 2019-11-24 DIAGNOSIS — M1990 Unspecified osteoarthritis, unspecified site: Secondary | ICD-10-CM

## 2019-11-24 DIAGNOSIS — R739 Hyperglycemia, unspecified: Secondary | ICD-10-CM

## 2019-11-24 DIAGNOSIS — M2550 Pain in unspecified joint: Secondary | ICD-10-CM

## 2019-11-24 DIAGNOSIS — E876 Hypokalemia: Secondary | ICD-10-CM

## 2019-11-24 DIAGNOSIS — M5126 Other intervertebral disc displacement, lumbar region: Secondary | ICD-10-CM

## 2019-11-24 DIAGNOSIS — M25569 Pain in unspecified knee: Secondary | ICD-10-CM

## 2019-11-24 DIAGNOSIS — M5030 Other cervical disc degeneration, unspecified cervical region: Secondary | ICD-10-CM

## 2019-11-24 DIAGNOSIS — D751 Secondary polycythemia: Secondary | ICD-10-CM

## 2019-11-24 DIAGNOSIS — R7982 Elevated C-reactive protein (CRP): Secondary | ICD-10-CM

## 2019-11-24 DIAGNOSIS — M549 Dorsalgia, unspecified: Secondary | ICD-10-CM

## 2019-11-24 DIAGNOSIS — M791 Myalgia, unspecified site: Secondary | ICD-10-CM

## 2019-11-24 DIAGNOSIS — M109 Gout, unspecified: Secondary | ICD-10-CM

## 2019-11-24 DIAGNOSIS — M79606 Pain in leg, unspecified: Secondary | ICD-10-CM

## 2019-11-24 DIAGNOSIS — M797 Fibromyalgia: Secondary | ICD-10-CM

## 2019-11-24 DIAGNOSIS — M47812 Spondylosis without myelopathy or radiculopathy, cervical region: Secondary | ICD-10-CM

## 2019-11-24 DIAGNOSIS — M48061 Spinal stenosis, lumbar region without neurogenic claudication: Secondary | ICD-10-CM

## 2019-11-24 DIAGNOSIS — F329 Major depressive disorder, single episode, unspecified: Secondary | ICD-10-CM

## 2019-11-24 DIAGNOSIS — M25511 Pain in right shoulder: Secondary | ICD-10-CM

## 2019-11-24 LAB — CBC AND DIFF
Lab: 0 10*3/uL (ref 0–0.20)
Lab: 0.1 10*3/uL (ref 0–0.45)
Lab: 0.4 10*3/uL (ref 0–0.80)
Lab: 1 % (ref >60)
Lab: 15 g/dL — ABNORMAL HIGH (ref 12.0–15.0)
Lab: 2.5 10*3/uL (ref 1.0–4.8)
Lab: 283 K/UL (ref 150–400)
Lab: 3.4 10*3/uL (ref 1.8–7.0)
Lab: 33 g/dL (ref 32.0–36.0)
Lab: 5.2 M/UL — ABNORMAL HIGH (ref 4.0–5.0)
Lab: 6.7 K/UL — ABNORMAL LOW (ref 4.5–11.0)
Lab: 87 FL (ref 80–100)

## 2019-11-24 NOTE — Progress Notes
Initial Visit / Consult Note - Rheumatology Clinic     Kathleen Steele is a 49 year old female here for her first medical evaluation to the rheumatology clinic    The patient is referred by Dr. Yetta Barre    Chief complaint: Arthralgia      History of present illness:   She tells me that she was diagnosed with Sjogren's syndrome around 2014 when she presented with inflammatory joint pain of the hands and other joints, she is also diagnosed with fibromyalgia syndrome, the symptoms include many years history of severe dry eyes and dry mouth, abnormal Schirmer's test, she tells me that she was found to have positive ANA and SSB antibodies in the past, she was treated with hydroxychloroquine and variable dose of prednisone which provide excellent relief of her joint pain and stiffness, she was also tried on methotrexate but therapy was stopped a few months back due to tooth infection, she has not resumed that since, she is also using pilocarpine prescribed by her rheumatologist who she used to see in [place], 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 and she describes classic nonlength dependent shooting pains in different parts of her body, she tried different medication including gabapentin, Lyrica and Cymbalta but had side effects or no benefit, most recently she has been placed on Pamelor, she does report photosensitivity, but no parotitis, there is no reported history of chronic ILD, or renal involvement in the records, she is also receiving narcotics through her previous pain management doctor and her primary physician is refilling that, she is asking if she can be maintained on small dose of prednisone given the dramatic response, she is also asking about low-dose naltrexone which we discussed, she had history of generalized   osteoarthritis and underwent laminectomy of lumbar spine, replacement of the first [facility] bilaterally she tells me that she is seeing a neurosurgeon now with plan to have myelogram and was told she had nerve compression of her cervical spine as well, which may require surgery in the future,  She was also diagnosed with gout in the past with classic podagra for which she uses febuxostat without recurrence, she is using 40 mg daily dose  Sjogren's, osteoarthritis, fibromyalgia, pain management    I reviewed her primary care provider's note from May 2021, she was seen to establish care then, after she had moved from [place] recently, she had a history of chronic muscle and joint pain fatigue and brain fog, she was diagnosed with fibromyalgia, arthritis, gout and Sjogren's,, there is a history of right hip pain for which there was a plan to have labral repair, 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, but no EMG was performed, she follows up at the pain clinic for chronic pain, her medications include amitriptyline, colchicine, cyclobenzaprine, cyclosporine 0.05 eyedrops, febuxostat 40 mg daily, Cymbalta, hydroxychloroquine, pilocarpine,, there is family history of arthritis, and methotrexate, along with naloxone nasal spray, she also receives narcotics through pain management  I see that her past surgical history also includes bilateral CMC arthroplasty in 2017 and 2018,, and right shoulder rotator cuff surgery with lumbar spine laminectomy at L4-L5 and S1       Complete ROS was performed and is otherwise negative      Old records reviewed and outside laboratory testing and/or radiological studies summarized in the HPI and below      Past Medical History:  Reviewed in O2    Past surgical  History:  Reviewed in O2    Family History:  Family history reviewed in the medical records, cousin with SLE       Social History:  Reviewed in [place]2      Allergies:   Reviewed in the records      Current medication list:  Reviewed       Physical Examination:    VITAL SIGNS: BP 115/65 (BP Source: Arm, Right Upper, Patient Position: Sitting)  - Pulse 103  - Temp 36.2 ?C (97.2 ?F) (Temporal)  - Resp 18  - Ht 160 cm (63)  - Wt 83.5 kg (184 lb)  - SpO2 100%  - BMI 32.59 kg/m?      GENERAL APPEARANCE: The patient is a well-nourished adult in no acute distress, very pleasant    EYES: No scleral erythema or conjunctival injection.     ENT: No oral ulcers or parotid enlargement.  Dry oral mucosa noted    NECK: No masses or thyroid enlargement.    LYMPHATIC: No cervical, supraclavicular lymphadenopathy    CARDIOVASCULAR: Heart rhythm is regular. No murmur, rub or gallop.    CHEST: Normal vesicular breath sounds. No wheezes, rales, pleural or friction rubs.    ABDOMEN: The abdomen is soft and nontender. Liver and spleen are nonpalpable.    EXTREMITIES: There is no evidence of clubbing, cyanosis, or edema.    SKIN: No rash, palpable purpura, digital ulcers, abnormal thickening or tight skin    NEUROLOGICAL: Normal gait and station, full strength in upper and lower extremities, normal sensation to light touch.    MUSCULOSKELETAL: The joints of the upper and lower extremities have full range of motion, no tenderness, swelling, deformity   except:  She has tenderness over many of the joints of the hands, at the MCP and PIP levels, fibromyalgia tender points are present in 16 out of 18 points      Five minute Schirmer's test without anesthesia:    OS: 13 mm    OD: 20 mm       Assessment:    Kathleen Steele is here to establish care, she recently moved from [place]  1.  Diagnosis of Sjogren's syndrome made around 2014 the symptoms seem to include severe sicca symptoms, abnormal eye exam requiring punctal plugs and cauterization,  use of pilocarpine for dry mouth, photosensitivity,positive SSB and ANA per the report, and biopsy-proven small fiber neuropathy    Treated with variable dose of prednisone with dramatic relief of her pain and fatigue, but also hydroxychloroquine and methotrexate, the latter was stopped few months ago in the context of tooth infection and has not been resumed since, it is unclear if this provided her with relief,  At this point in time her main complaint seems to be pain and fatigue, given the response to prednisone and until her work-up is completed we will start her on a small dose of prednisone, side effects were discussed and she will let me know if she experiences any, will start 5 mg daily and I told her I plan to taper this off next visit, we will consider leflunomide or azathioprine, she will be referred for lip biopsy, and we will repeat Sjogren's activity labs today, I would refer her to ophthalmology service for complete ophthalmic evaluation     All questions were answered to her satisfaction    2.  Fibromyalgia syndrome, she has failed several medications currently using oxycodone, which she has been on for several years and recently refilled by her  primary physician    3.  Fibromyalgia syndrome, established diagnosis, tried several pharmacological therapies, she is asking about low-dose naltrexone I told her this may be good idea but she will have the first withdrawal oxycodone, will discuss the risks further on subsequent visits    4.  History of severe generalized osteoarthritis, she is status post laminectomy of the lumbar spine, potentially she may need similar procedure in the cervical spine per the history, she also had replacement of the first [facility] bilaterally    5.  Right hip pain, the notes suggest history of impingement, surgical evaluation is pending neurosurgery evaluation for her spine pain and the need for additional surgical interventions    Plan:    Consider azathioprine or leflunomide  Refer for lip biopsy  Refer for ophthalmic evaluation  Sjogren's baseline activity labs  Continue hydroxychloroquine  Start prednisone 5 mg daily     Return to the clinic in 2-3 months    Total Time Today was 60 minutes in the following activities: Preparing to see the patient, Obtaining and/or reviewing separately obtained history, Performing a medically appropriate examination and/or evaluation, Counseling and educating the patient/family/caregiver, Ordering medications, tests, or procedures, Referring and communication with other health care professionals (when not separately reported), Documenting clinical information in the electronic or other health record, Independently interpreting results (not separately reported) and communicating results to the patient/family/caregiver and Care coordination (not separately reported)        Leverne Humbles, MD    Because this dictation was prepared with voice recognition software, there remains a potential for typographical errors or incorrect word choices by the system. We apologize for any inadvertent inconvenience from such an error.

## 2019-11-25 ENCOUNTER — Encounter: Admit: 2019-11-25 | Discharge: 2019-11-25

## 2019-11-25 LAB — COMPREHENSIVE METABOLIC PANEL
Lab: 12 % (ref 3–12)
Lab: 13 U/L (ref 7–56)
Lab: 141 MMOL/L (ref 137–147)
Lab: 3.5 MMOL/L (ref 3.5–5.1)
Lab: >60 mL/min (ref >60)

## 2019-11-25 LAB — RHEUMATOID FACTOR (RF): Lab: <10 [IU]/mL (ref <25)

## 2019-11-25 LAB — HEPATITIS B SURFACE AB: Lab: POSITIVE — AB

## 2019-11-25 LAB — C REACTIVE PROTEIN (CRP): Lab: 1 mg/dL — ABNORMAL HIGH (ref <1.0)

## 2019-11-25 LAB — C4 COMPLEMENT 4: Lab: 57 mg/dL — ABNORMAL HIGH (ref 10–49)

## 2019-11-25 LAB — URIC ACID: Lab: 7 mg/dL — ABNORMAL HIGH (ref 2.0–7.0)

## 2019-11-25 LAB — HEPATITIS B SURFACE AG

## 2019-11-25 LAB — C3 COMPLEMENT 3: Lab: 194 mg/dL (ref 88–200)

## 2019-11-25 LAB — HEPATITIS B CORE AB TOT (IGG+IGM)

## 2019-11-25 LAB — IMMUNOGLOBULINS-IGA,IGG,IGM
Lab: 317 mg/dL (ref 70–390)
Lab: 48 mg/dL (ref 38–328)
Lab: 948 mg/dL (ref 762–1488)

## 2019-11-25 LAB — TSH WITH FREE T4 REFLEX: Lab: 4.2 uU/mL — ABNORMAL HIGH (ref 0.35–5.00)

## 2019-11-25 LAB — SED RATE: Lab: 11 mm/h (ref 0–20)

## 2019-11-25 LAB — HEPATITIS C ANTIBODY W REFLEX HCV PCR QUANT

## 2019-11-25 MED ORDER — PREDNISONE 5 MG PO TAB
5 mg | ORAL_TABLET | Freq: Every day | ORAL | 0 refills | Status: AC
Start: 2019-11-25 — End: ?

## 2019-11-25 MED ORDER — ERGOCALCIFEROL (VITAMIN D2) 1,250 MCG (50,000 UNIT) PO CAP
1 | ORAL_CAPSULE | ORAL | 0 refills | 56.00000 days | Status: AC
Start: 2019-11-25 — End: ?

## 2019-12-17 ENCOUNTER — Encounter: Admit: 2019-12-17 | Discharge: 2019-12-17

## 2019-12-17 ENCOUNTER — Ambulatory Visit: Admit: 2019-12-17 | Discharge: 2019-12-17

## 2019-12-17 DIAGNOSIS — I7090 Unspecified atherosclerosis: Secondary | ICD-10-CM

## 2019-12-17 DIAGNOSIS — F1190 Opioid use, unspecified, uncomplicated: Secondary | ICD-10-CM

## 2019-12-17 DIAGNOSIS — M25569 Pain in unspecified knee: Secondary | ICD-10-CM

## 2019-12-17 DIAGNOSIS — M109 Gout, unspecified: Secondary | ICD-10-CM

## 2019-12-17 DIAGNOSIS — M3500 Sicca syndrome, unspecified: Secondary | ICD-10-CM

## 2019-12-17 DIAGNOSIS — F909 Attention-deficit hyperactivity disorder, unspecified type: Secondary | ICD-10-CM

## 2019-12-17 DIAGNOSIS — M791 Myalgia, unspecified site: Secondary | ICD-10-CM

## 2019-12-17 DIAGNOSIS — M47812 Spondylosis without myelopathy or radiculopathy, cervical region: Secondary | ICD-10-CM

## 2019-12-17 DIAGNOSIS — E876 Hypokalemia: Secondary | ICD-10-CM

## 2019-12-17 DIAGNOSIS — R768 Other specified abnormal immunological findings in serum: Secondary | ICD-10-CM

## 2019-12-17 DIAGNOSIS — H04123 Dry eye syndrome of bilateral lacrimal glands: Secondary | ICD-10-CM

## 2019-12-17 DIAGNOSIS — M4802 Spinal stenosis, cervical region: Secondary | ICD-10-CM

## 2019-12-17 DIAGNOSIS — M2550 Pain in unspecified joint: Secondary | ICD-10-CM

## 2019-12-17 DIAGNOSIS — R682 Dry mouth, unspecified: Secondary | ICD-10-CM

## 2019-12-17 DIAGNOSIS — M1990 Unspecified osteoarthritis, unspecified site: Secondary | ICD-10-CM

## 2019-12-17 DIAGNOSIS — D751 Secondary polycythemia: Secondary | ICD-10-CM

## 2019-12-17 DIAGNOSIS — M797 Fibromyalgia: Secondary | ICD-10-CM

## 2019-12-17 DIAGNOSIS — M5020 Other cervical disc displacement, unspecified cervical region: Secondary | ICD-10-CM

## 2019-12-17 DIAGNOSIS — R7982 Elevated C-reactive protein (CRP): Secondary | ICD-10-CM

## 2019-12-17 DIAGNOSIS — M5030 Other cervical disc degeneration, unspecified cervical region: Secondary | ICD-10-CM

## 2019-12-17 DIAGNOSIS — M25511 Pain in right shoulder: Secondary | ICD-10-CM

## 2019-12-17 DIAGNOSIS — M47816 Spondylosis without myelopathy or radiculopathy, lumbar region: Secondary | ICD-10-CM

## 2019-12-17 DIAGNOSIS — M5136 Other intervertebral disc degeneration, lumbar region: Secondary | ICD-10-CM

## 2019-12-17 DIAGNOSIS — F32A Depression: Secondary | ICD-10-CM

## 2019-12-17 DIAGNOSIS — M48061 Spinal stenosis, lumbar region without neurogenic claudication: Secondary | ICD-10-CM

## 2019-12-17 DIAGNOSIS — G8929 Other chronic pain: Secondary | ICD-10-CM

## 2019-12-17 DIAGNOSIS — M542 Cervicalgia: Secondary | ICD-10-CM

## 2019-12-17 DIAGNOSIS — E785 Hyperlipidemia, unspecified: Secondary | ICD-10-CM

## 2019-12-17 DIAGNOSIS — M5126 Other intervertebral disc displacement, lumbar region: Secondary | ICD-10-CM

## 2019-12-17 DIAGNOSIS — M549 Dorsalgia, unspecified: Secondary | ICD-10-CM

## 2019-12-17 DIAGNOSIS — M79606 Pain in leg, unspecified: Secondary | ICD-10-CM

## 2019-12-17 DIAGNOSIS — R739 Hyperglycemia, unspecified: Secondary | ICD-10-CM

## 2019-12-17 NOTE — Progress Notes
HISTORY OF PRESENT ILLNESS:  Kathleen Steele is a 49 y.o. female who presents to clinic today as a new patient referred by Dr. Cecelia Byars for history of elevated ANA and SICCA symptoms.  She had labs recently which did not show an elevated ANA but did have an elevated SSB.  Her SSA was normal.  She has a history of Sjogren's disease diagnosed in 2014.  She had inflammatory arthritis.  She was living in [place] at the time.  She recently moved to [place].  She has been on pilocarpine for the dry mouth and hydroxychloroquine.  She has been treated with methotrexate in the past.  She has had a history of needing punctal plugs in the past.  She has a biopsy-proven small fiber neuropathy.       Review of Systems   Constitutional: Negative.    HENT: Negative.    Eyes: Negative.    Respiratory: Negative.    Cardiovascular: Negative.    Gastrointestinal: Negative.    Endocrine: Negative.    Genitourinary: Negative.    Musculoskeletal: Negative.    Skin: Negative.    Allergic/Immunologic: Negative.    Neurological: Negative.    Hematological: Negative.    Psychiatric/Behavioral: Negative.        Past Medical/Surgical History  She  has a past medical history of ADHD, Arthritis, Atherosclerosis, Bulging lumbar disc, Bulging of cervical intervertebral disc, Cervical osteoarthritis, Cervical spinal stenosis, Chronic back pain, Chronic joint pain, Chronic narcotic use, Chronic neck pain, Chronic pain, Degenerative cervical disc, Depression, Elevated C-reactive protein (CRP), Facet arthritis of cervical region, Fibromyalgia, Foraminal stenosis of cervical region, Gout, Hyperglycemia, Hyperlipidemia, Hypokalemia, Knee pain, Leg pain, Lumbar degenerative disc disease, Lumbar degenerative disc disease, Lumbar facet arthropathy, Lumbar foraminal stenosis, Lumbar spinal stenosis, Myalgia, Osteoarthritis of lumbar spine, Polycythemia, Shoulder pain, right, and Sjogren's syndrome (HCC).  Her  has a past surgical history that includes Upper gastrointestinal endoscopy (02/2015); Colonoscopy (02/2015); bilateral salpingo-oophorectomy (Bilateral, 09/2013); partial hysterectomy (11/2004); Abdomen surgery (11/2004); tubal ligation (02/2004); cystectomy (02/2004); Septoplasty (11/2003); breast augmentation (2000); wisdom teeth extraction (02/1992); rotator cuff repair (Right, 07/2017); lumbar laminectomy (02/2018); and Tear duct surgery (09/2018).     Past Family/Social History  Her family history includes Gout in her brother and father.  She  reports that she has quit smoking. She has never used smokeless tobacco. She reports current alcohol use. She reports previous drug use.     Medications/Allergies/Immunizations  Her current medication(s) include:   Current Outpatient Medications   Medication Sig Dispense Refill   ? amphetamine-dextroamphetamine XR (ADDERALL XR) 20 mg capsule Take 20 mg by mouth daily     ? aspirin EC 81 mg tablet Take 81 mg by mouth daily. Take with food.     ? betamethasone dipropionate (DIPROLENE) 0.05 % topical cream Apply  topically to affected area twice daily.     ? biotin 5 mg tablet Take 5 mg by mouth.     ? colchicine (COLCRYS) 0.6 mg tablet Take 0.6 mg by mouth daily. Per pt 1/2 tablet every other day     ? cyanocobalamin (VITAMIN B-12) 500 mcg tablet Take 1,000 mcg by mouth daily.     ? cyclobenzaprine (FLEXERIL) 10 mg tablet Take 10 mg by mouth at bedtime as needed for Muscle Cramps. AM and PM     ? cycloSPORINE (RESTASIS) 0.05 % ophthalmic emulsion Apply 1 drop to both eyes twice daily.     ? dextroamphetamine-amphetamine (ADDERALL) 10 mg tablet Take 10 mg by mouth  daily  Afternoon     ? dulaglutide (TRULICITY) 1.5 mg/0.5 mL injection pen Inject 1.5 mg under the skin every 7 days.     ? ergocalciferol (vitamin D2) (DRISDOL) 1,250 mcg (50,000 unit) capsule Take one capsule by mouth every 7 days. 12 capsule 0   ? ergocalciferol (vitamin D2) (VITAMIN D PO) Take  by mouth.     ? estradioL (ESTRACE) 1 mg tablet Take 1 mg by mouth daily. .5 mg PM per pt     ? famotidine (PEPCID) 40 mg tablet Take 40 mg by mouth daily. AM     ? febuxostat (ULORIC) 40 mg tablet Take 40 mg by mouth daily. PM     ? fluconazole (DIFLUCAN) 150 mg tablet Take 1 tablet by mouth once. Every other day for 3 days PRN     ? fluoxetine (PROZAC) 40 mg capsule Take 40 mg by mouth daily.     ? folic acid (FOLVITE) 1 mg tablet Take 1 mg by mouth daily. 2 tablets     ? furosemide (LASIX) 40 mg tablet Take 40 mg by mouth every morning.     ? hydrOXYchloroQUINE (PLAQUENIL) 200 mg tablet Take 400 mg by mouth daily. Take with food.200 mg AM 200 mg PM     ? ibuprofen (MOTRIN) 800 mg tablet Take 800 mg by mouth every 8 hours as needed for Pain. Take with food.     ? ivermectin (SOOLANTRA) 1 % cream Apply  topically to affected area daily. PM     ? metroNIDAZOLE (NORITATE) 1 % topical cream Apply  topically to affected area daily. AM     ? MULTIVITAMIN PO Take  by mouth.     ? naloxone (NARCAN) 4 mg/actuation nasal spray Insert 4 mg into nose as directed as Needed.     ? nystatin (MYCOSTATIN) 100,000 units/mL oral suspension Take 500,000 Units by mouth four times daily.     ? oxyCODONE 10 mg tablet Take 10 mg by mouth every 6 hours as needed for Pain     ? pilocarpine HCL (SALAGEN) 5 mg tablet Take 5 mg by mouth three times daily.     ? potassium chloride SR (K-DUR) 20 mEq tablet Take 1 tablet by mouth daily. Take with a meal and a full glass of water.  AM and Noon     ? pravastatin (PRAVACHOL) 40 mg tablet Take 40 mg by mouth at bedtime daily.     ? predniSONE (DELTASONE) 5 mg tablet Take one tablet by mouth daily with breakfast. 90 tablet 0     No current facility-administered medications for this visit.       Allergies: Tramadol, Allopurinol, Lyrica [pregabalin], Gabapentin, Meperidine, and Robaxin [methocarbamol]          Vitals:    12/17/19 1501   BP: (!) 141/81   Pulse: 106   Weight: 83.9 kg (185 lb)   Height: 160 cm (63)     Body mass index is 32.77 kg/m?Marland Kitchen Physical Exam    General: 49 y.o. female who is awake and alert and no acute distress.  SKIN:  Skin examination was unremarkable no mass or lesion appreciated no evidence of cellulitis.  No evidence of skin cancers.    EYES: Extraocular muscle mobility is intact.  No conjunctival hemorrhage.   EARS:The auricles were without deformity.  External auditory canals are clear no evidence of cerumen fungus or bacterial infection.  Tympanic membranes are without effusion or retraction.  No evidence of perforation.  No cholesteatoma.    NOSE: The nasal airway shows a straight septum without evidence of perforation or significant crusting.  There are no evidence of polypoid changes.  The inferior turbinate is not congested.  There are no active bleeding sites.    ORAL CAVITY: The oral cavity shows buccal surfaces are dry without evidence of lichenoid changes or mucosal disease.  No leukoplakia.  The floor of mouth is without edema.  Wharton's ducts and Stensen's ducts are patent with limited salivary flow.  The tongue is without mass or lesion. There is normal mobility and sensation.    OROPHARYNX: The oropharynx shows normal mucosa.   No significant postnasal drainage.  Uvula is without edema.  NECK: Neck was flat no adenopathy or thyromegaly.  No parotid masses or submandibular gland masses.    NEURO: Cranial nerves are intact bilaterally.  Voice quality is excellent.   PULMONARY:  No airway distress.  No stridor.  No retractions.        ASSESSMENT AND PLAN:       Yesinia Holzschuh has an elevated ANA history and recent SSB elevation.  She has had a history of severe dry eyes and dry mouth.  To help confirm the diagnosis of Sjogren's a lip biopsy was decided upon today.  She tolerated the biopsy well.  We did discuss the potential risk and complications as well as benefits.  I will be contacting her in a week with the results.  Conservative measures for dry mouth discussed.

## 2019-12-19 ENCOUNTER — Encounter: Admit: 2019-12-19 | Discharge: 2019-12-19

## 2019-12-19 NOTE — Telephone Encounter
Pt informed results are negative.

## 2019-12-19 NOTE — Telephone Encounter
-----   Message from Oneita Hurt, MD sent at 12/19/2019  8:05 AM CDT -----  Please let patient know her biopsy did not suggest Sjogren's.  We will send these results to Dr. Cecelia Byars as well.

## 2019-12-26 IMAGING — MG DIGITAL SCREENING BILAT W/ TOMO W/ CAD
6 of 10 series · 6 of 30 positions shown · non-contrast
Comparison: Previous exam(s).

CLINICAL DATA: Screening.

EXAM:
DIGITAL SCREENING BILATERAL MAMMOGRAM WITH TOMO AND CAD

[R MLO synth-2D]
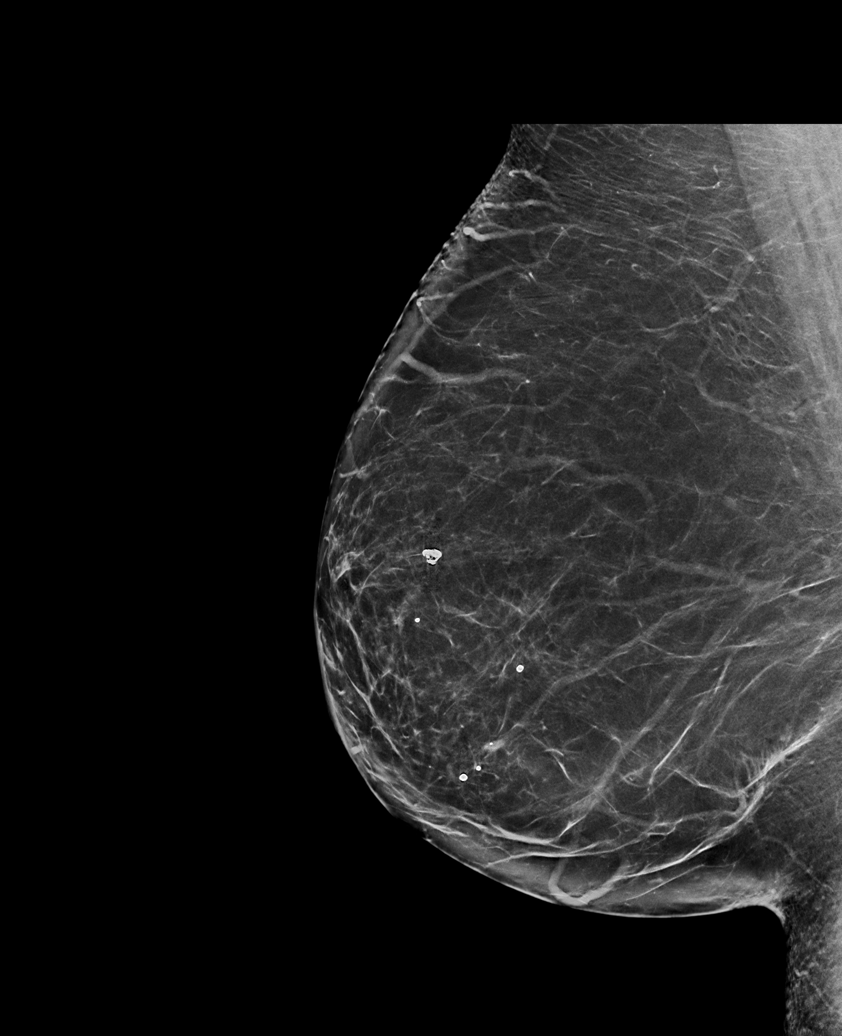

[L MLO synth-2D]
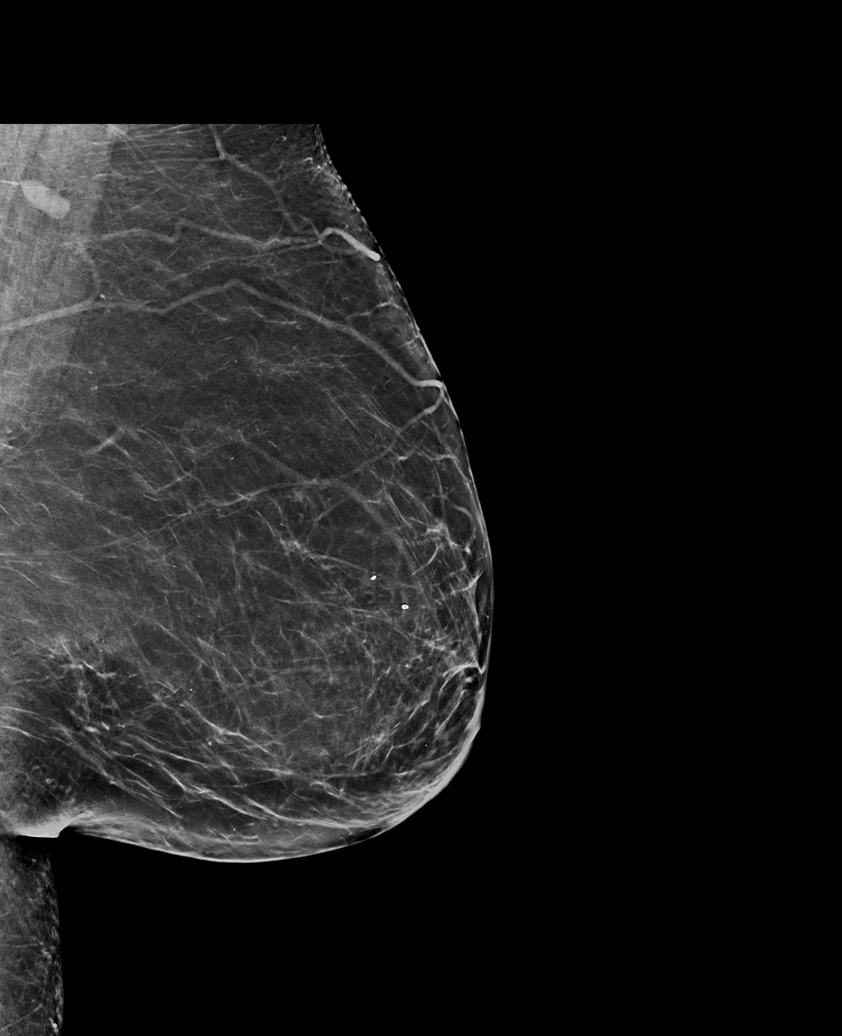

[L CC synth-2D (1 of 2)]
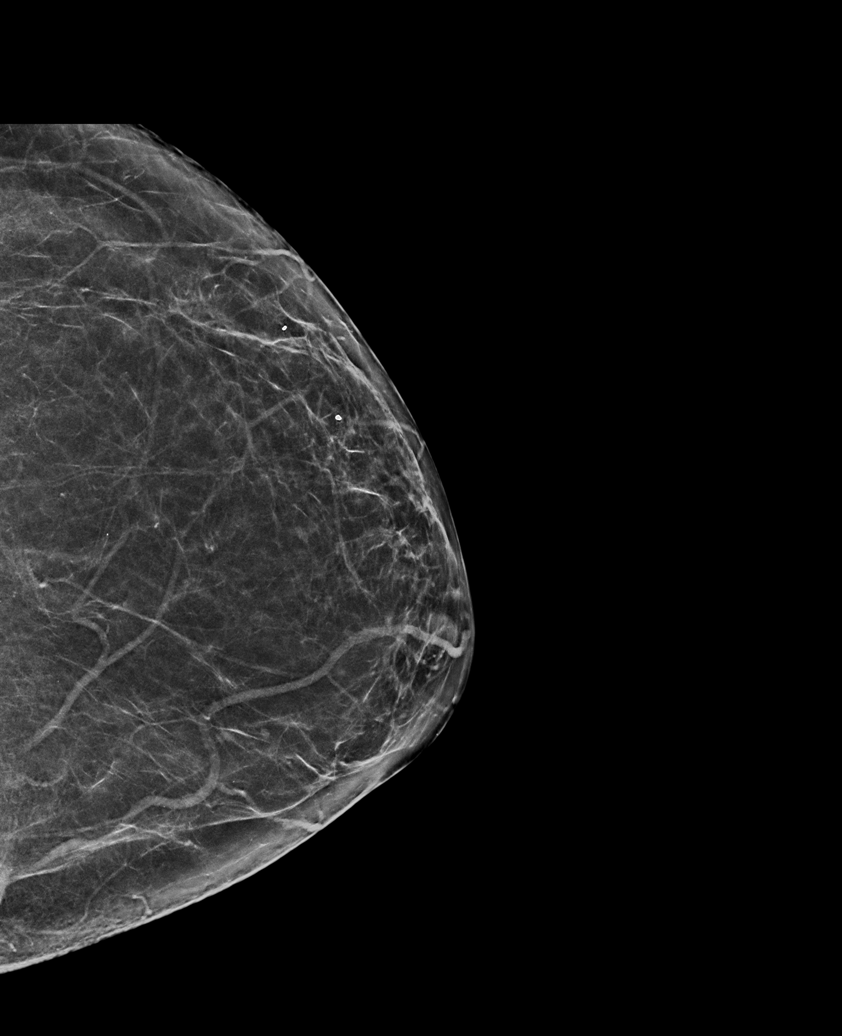

[R CC synth-2D]
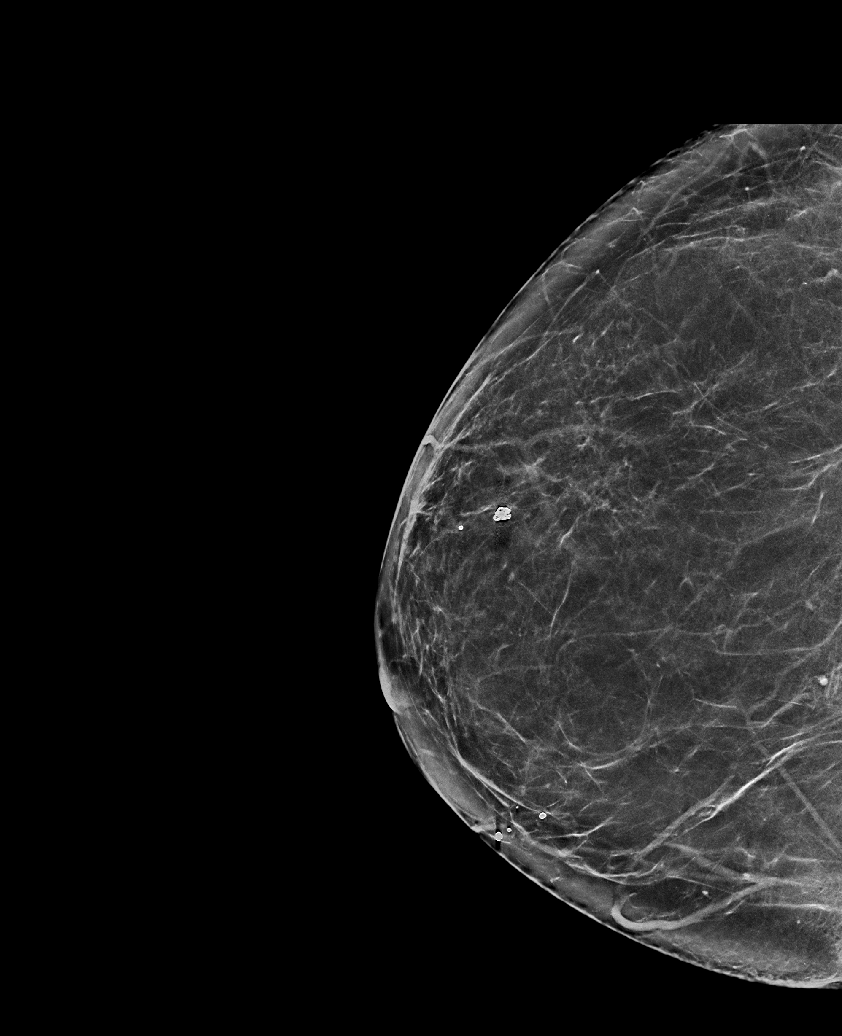

[L CC synth-2D (2 of 2)]
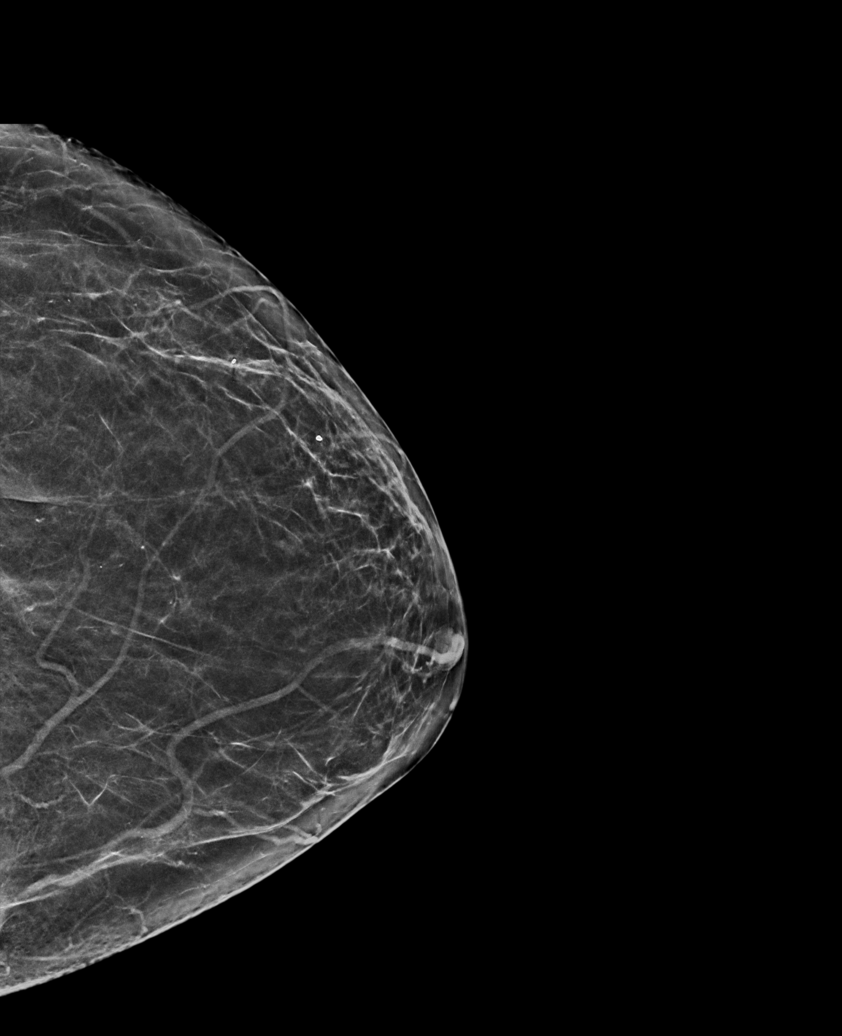

[L CC tomo · tomo slice 33/65.0]
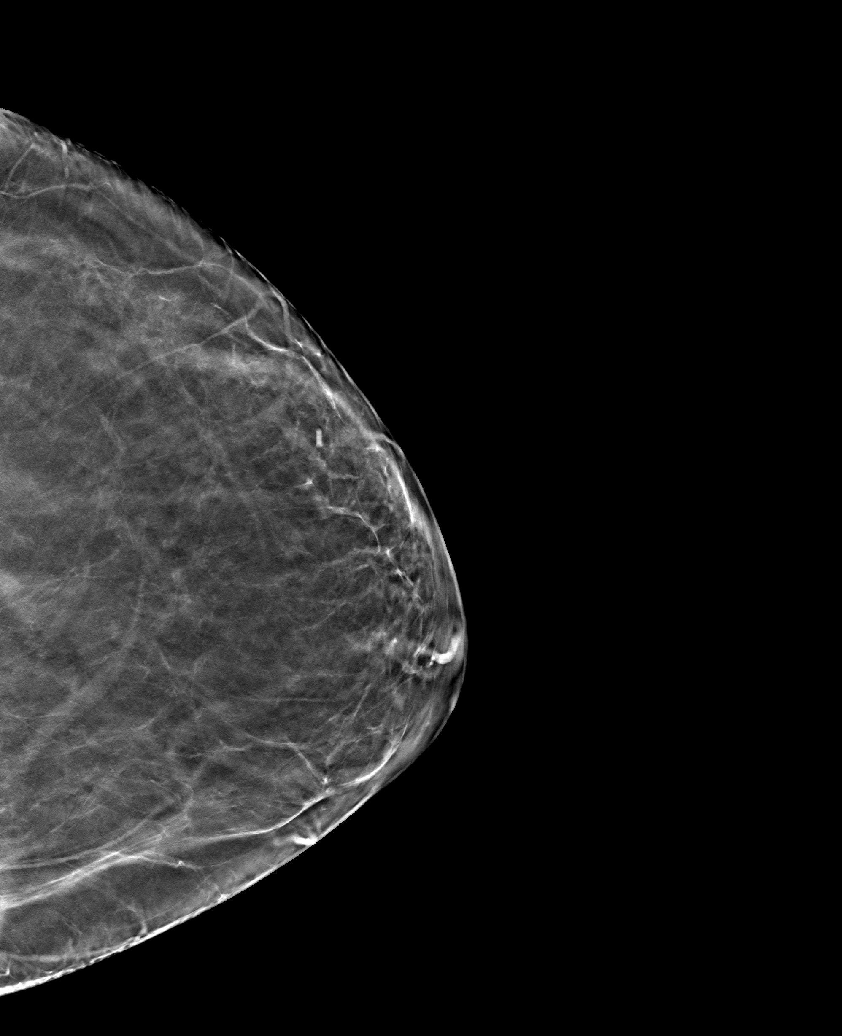

[6 of 30 positions shown; findings below may reference images not displayed]

ACR Breast Density Category b: There are scattered areas of
fibroglandular density.
FINDINGS: There are no findings suspicious for malignancy. Images were
processed with CAD.
IMPRESSION: No mammographic evidence of malignancy. A result letter of this
screening mammogram will be mailed directly to the patient.

RECOMMENDATION:
Screening mammogram in one year. (Code:[id number])

BI-RADS CATEGORY  1: Negative.

## 2020-01-20 ENCOUNTER — Ambulatory Visit: Admit: 2020-01-20 | Discharge: 2020-01-21

## 2020-01-20 ENCOUNTER — Encounter: Admit: 2020-01-20 | Discharge: 2020-01-20

## 2020-01-20 DIAGNOSIS — R739 Hyperglycemia, unspecified: Secondary | ICD-10-CM

## 2020-01-20 DIAGNOSIS — M25511 Pain in right shoulder: Secondary | ICD-10-CM

## 2020-01-20 DIAGNOSIS — M791 Myalgia, unspecified site: Secondary | ICD-10-CM

## 2020-01-20 DIAGNOSIS — M3500 Sicca syndrome, unspecified: Secondary | ICD-10-CM

## 2020-01-20 DIAGNOSIS — M549 Dorsalgia, unspecified: Secondary | ICD-10-CM

## 2020-01-20 DIAGNOSIS — M4802 Spinal stenosis, cervical region: Secondary | ICD-10-CM

## 2020-01-20 DIAGNOSIS — M5126 Other intervertebral disc displacement, lumbar region: Secondary | ICD-10-CM

## 2020-01-20 DIAGNOSIS — F1190 Opioid use, unspecified, uncomplicated: Secondary | ICD-10-CM

## 2020-01-20 DIAGNOSIS — M47812 Spondylosis without myelopathy or radiculopathy, cervical region: Secondary | ICD-10-CM

## 2020-01-20 DIAGNOSIS — M79606 Pain in leg, unspecified: Secondary | ICD-10-CM

## 2020-01-20 DIAGNOSIS — M5136 Other intervertebral disc degeneration, lumbar region: Secondary | ICD-10-CM

## 2020-01-20 DIAGNOSIS — M2550 Pain in unspecified joint: Secondary | ICD-10-CM

## 2020-01-20 DIAGNOSIS — G8929 Other chronic pain: Secondary | ICD-10-CM

## 2020-01-20 DIAGNOSIS — M542 Cervicalgia: Secondary | ICD-10-CM

## 2020-01-20 DIAGNOSIS — E876 Hypokalemia: Secondary | ICD-10-CM

## 2020-01-20 DIAGNOSIS — M48061 Spinal stenosis, lumbar region without neurogenic claudication: Secondary | ICD-10-CM

## 2020-01-20 DIAGNOSIS — D751 Secondary polycythemia: Secondary | ICD-10-CM

## 2020-01-20 DIAGNOSIS — M47816 Spondylosis without myelopathy or radiculopathy, lumbar region: Secondary | ICD-10-CM

## 2020-01-20 DIAGNOSIS — E559 Vitamin D deficiency, unspecified: Secondary | ICD-10-CM

## 2020-01-20 DIAGNOSIS — R7982 Elevated C-reactive protein (CRP): Secondary | ICD-10-CM

## 2020-01-20 DIAGNOSIS — M109 Gout, unspecified: Secondary | ICD-10-CM

## 2020-01-20 DIAGNOSIS — M1990 Unspecified osteoarthritis, unspecified site: Secondary | ICD-10-CM

## 2020-01-20 DIAGNOSIS — F32A Depression: Secondary | ICD-10-CM

## 2020-01-20 DIAGNOSIS — M5020 Other cervical disc displacement, unspecified cervical region: Secondary | ICD-10-CM

## 2020-01-20 DIAGNOSIS — I7090 Unspecified atherosclerosis: Secondary | ICD-10-CM

## 2020-01-20 DIAGNOSIS — M797 Fibromyalgia: Secondary | ICD-10-CM

## 2020-01-20 DIAGNOSIS — E785 Hyperlipidemia, unspecified: Secondary | ICD-10-CM

## 2020-01-20 DIAGNOSIS — M25569 Pain in unspecified knee: Secondary | ICD-10-CM

## 2020-01-20 DIAGNOSIS — M5030 Other cervical disc degeneration, unspecified cervical region: Secondary | ICD-10-CM

## 2020-01-20 DIAGNOSIS — F909 Attention-deficit hyperactivity disorder, unspecified type: Secondary | ICD-10-CM

## 2020-01-20 LAB — URINALYSIS DIPSTICK REFLEX TO CULTURE
Lab: 1 (ref 1.005–1.030)
Lab: NEGATIVE
Lab: NEGATIVE
Lab: NEGATIVE
Lab: NEGATIVE
Lab: NEGATIVE
Lab: NEGATIVE

## 2020-01-20 LAB — CBC AND DIFF
Lab: 0.5 10*3/uL (ref 0–0.80)
Lab: 1 % (ref >60)
Lab: 1 % — ABNORMAL LOW (ref >60)
Lab: 1.6 10*3/uL (ref 1.0–4.8)
Lab: 14 % (ref 11–15)
Lab: 20 % — ABNORMAL LOW (ref 24–44)
Lab: 29 pg (ref 26–34)
Lab: 316 10*3/uL (ref 150–400)
Lab: 33 g/dL (ref 32.0–36.0)
Lab: 48 % — ABNORMAL HIGH (ref 36–45)
Lab: 5.4 M/UL — ABNORMAL HIGH (ref 4.0–5.0)
Lab: 5.6 10*3/uL (ref 1.8–7.0)
Lab: 7 % — ABNORMAL HIGH (ref 4–12)
Lab: 7.6 FL (ref 7–11)
Lab: 71 % (ref 41–77)
Lab: 8 K/UL — ABNORMAL LOW (ref 4.5–11.0)
Lab: 88 FL (ref 80–100)

## 2020-01-20 LAB — 25-OH VITAMIN D (D2 + D3): Lab: 31 ng/mL — ABNORMAL HIGH (ref 30–80)

## 2020-01-20 LAB — COMPREHENSIVE METABOLIC PANEL
Lab: 142 MMOL/L (ref 137–147)
Lab: 4.7 MMOL/L (ref 3.5–5.1)

## 2020-01-20 LAB — URINALYSIS MICROSCOPIC REFLEX TO CULTURE

## 2020-01-20 LAB — PROTEIN/CR RATIO,UR RAN
Lab: 5 mg/dL
Lab: 61 mg/dL

## 2020-01-20 MED ORDER — PREDNISONE 5 MG PO TAB
5 mg | ORAL_TABLET | Freq: Every day | ORAL | 0 refills
Start: 2020-01-20 — End: ?

## 2020-01-20 MED ORDER — LEFLUNOMIDE 10 MG PO TAB
10 mg | ORAL_TABLET | Freq: Every day | ORAL | 1 refills | 90.00000 days | Status: AC
Start: 2020-01-20 — End: ?

## 2020-01-20 NOTE — Progress Notes
Kathleen Steele is a 49 year old female here for a follow-up visit      Chief complaint: Arthralgia      History of present illness:   She tells me that she was diagnosed with Sjogren's syndrome around 2014 when she presented with inflammatory joint pain of the hands and other joints, she is also diagnosed with fibromyalgia syndrome, the symptoms include many years history of severe dry eyes and dry mouth, abnormal Schirmer's test, she tells me that she was found to have positive ANA and SSB antibodies in the past, she was treated with hydroxychloroquine and variable dose of prednisone which provide excellent relief of her joint pain and stiffness, she was also tried on methotrexate but therapy was stopped a few months back due to tooth infection, she has not resumed that since, she is also using pilocarpine prescribed by her rheumatologist who she used to see in [place], 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 and she describes classic nonlength dependent shooting pains in different parts of her body, she tried different medication including gabapentin, Lyrica and Cymbalta but had side effects or no benefit, most recently she has been placed on Pamelor, she does report photosensitivity, but no parotitis, there is no reported history of chronic ILD, or renal involvement in the records, she is also receiving narcotics through her previous pain management doctor and her primary physician is refilling that, she is asking if she can be maintained on small dose of prednisone given the dramatic response, she is also asking about low-dose naltrexone which we discussed, she had history of generalized   osteoarthritis and underwent laminectomy of lumbar spine, replacement of the first [facility] bilaterally she tells me that she is seeing a neurosurgeon now with plan to have myelogram and was told she had nerve compression of her cervical spine as well, which may require surgery in the future,  She was also diagnosed with gout in the past with classic podagra for which she uses febuxostat without recurrence, she is using 40 mg daily dose  Sjogren's, osteoarthritis, fibromyalgia, pain management    I reviewed her primary care provider's note from May 2021, she was seen to establish care then, after she had moved from [place] recently, she had a history of chronic muscle and joint pain fatigue and brain fog, she was diagnosed with fibromyalgia, arthritis, gout and Sjogren's,, there is a history of right hip pain for which there was a plan to have labral repair, 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, but no EMG was performed, she follows up at the pain clinic for chronic pain, her medications include amitriptyline, colchicine, cyclobenzaprine, cyclosporine 0.05 eyedrops, febuxostat 40 mg daily, Cymbalta, hydroxychloroquine, pilocarpine,, there is family history of arthritis, and methotrexate, along with naloxone nasal spray, she also receives narcotics through pain management  I see that her past surgical history also includes bilateral CMC arthroplasty in 2017 and 2018,, and right shoulder rotator cuff surgery with lumbar spine laminectomy at L4-L5 and S1    Interval history:    She was seen as a new patient in September at which time she presented with history of Sjogren's syndrome versus undifferentiated connective tissue disease, we referred her for lip biopsy which came back negative, 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, labs revealed mildly elevated CRP, low titer SSB 4.6, otherwise negative SSA, ANA and rheumatoid factor, kappa light chain was slightly elevated with normal ratio, vitamin D  was low at 20 and she started replacement  I started on prednisone 5 mg daily with dramatic improvement of her joint pain involving the hands wrists, ankles and feet, no side effects reported, some improvement of her back was also noted, she has history of laminectomy and first CMC osteoarthritis  She has been using hydroxychloroquine and admitted that when she held therapy the symptoms of joint pain worsened  She is using Restasis and fish oil for dry eyes also started doing eyelid scrubs  After she was seen by a local ophthalmologist        Past Medical History:  Reviewed in O2    Past surgical History:  Reviewed in O2    Family History:  Family history reviewed in the medical records, cousin with SLE       Social History:  Reviewed in O2      Allergies:   Reviewed in the records      Current medication list:  Reviewed       Physical Examination:    VITAL SIGNS: BP 130/80 (BP Source: Arm, Right Upper, Patient Position: Sitting)  - Pulse 101  - Temp 36.4 ?C (97.5 ?F) (Temporal)  - Resp 18  - Ht 160 cm (63)  - Wt 83.5 kg (184 lb)  - SpO2 99%  - BMI 32.59 kg/m?      GENERAL APPEARANCE: The patient is a well-nourished adult in no acute distress, very pleasant    Five minute Schirmer's test without anesthesia:    OS: 13 mm    OD: 20 mm       Assessment:    Kathleen Steele is here to establish care, she recently moved from [place]    1.  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  Fibromyalgia syndrome, established diagnosis, tried several pharmacological therapies, she is asking about low-dose naltrexone I told her this may be good idea but she will have the first withdrawal oxycodone, will discuss the risks further on subsequent visits    3.  History of severe generalized osteoarthritis, she is status post laminectomy of the lumbar spine, potentially she may need similar procedure in the cervical spine per the history, she also had replacement of the first [facility] bilaterally    4.  Right hip pain, the notes suggest history of impingement, surgical evaluation is pending neurosurgery evaluation for her spine pain and the need for additional surgical interventions    5.  Low vitamin D, she started repletion  last visit, will recheck levels today    Plan:  Start leflunomide 10 mg daily  Continue hydroxychloroquine and prednisone 5 mg daily  Repeat vitamin D levels    Return to the clinic in February     Leverne Humbles, MD    Because this dictation was prepared with voice recognition software, there remains a potential for typographical errors or incorrect word choices by the system. We apologize for any inadvertent inconvenience from such an error.

## 2020-01-20 NOTE — Patient Instructions
Start leflunomide 1 tablet/day    Weekly CBC and CMP, until the next visit    Main on prednisone 5 mg daily without any adjustment, we plan to lower the dose next visit    Continue Hydroxychloroquine     We will recheck vitamin D level today, remain on replacement    Aloe up with me in about 3-4 months

## 2020-01-21 DIAGNOSIS — E559 Vitamin D deficiency, unspecified: Secondary | ICD-10-CM

## 2020-01-28 ENCOUNTER — Encounter: Admit: 2020-01-28 | Discharge: 2020-01-28

## 2020-01-28 MED ORDER — PREDNISONE 5 MG PO TAB
5 mg | ORAL_TABLET | Freq: Every day | ORAL | 0 refills
Start: 2020-01-28 — End: ?

## 2020-02-25 ENCOUNTER — Encounter: Admit: 2020-02-25 | Discharge: 2020-02-25

## 2020-02-25 DIAGNOSIS — E559 Vitamin D deficiency, unspecified: Secondary | ICD-10-CM

## 2020-03-24 ENCOUNTER — Encounter: Admit: 2020-03-24 | Discharge: 2020-03-24

## 2020-03-24 DIAGNOSIS — E559 Vitamin D deficiency, unspecified: Secondary | ICD-10-CM

## 2020-03-31 ENCOUNTER — Encounter: Admit: 2020-03-31 | Discharge: 2020-03-31

## 2020-03-31 MED ORDER — LEFLUNOMIDE 10 MG PO TAB
10 mg | ORAL_TABLET | Freq: Every day | ORAL | 1 refills
Start: 2020-03-31 — End: ?

## 2020-04-28 ENCOUNTER — Encounter

## 2020-04-28 DIAGNOSIS — E559 Vitamin D deficiency, unspecified: Secondary | ICD-10-CM

## 2020-05-03 ENCOUNTER — Encounter

## 2020-05-03 DIAGNOSIS — M109 Gout, unspecified: Secondary | ICD-10-CM

## 2020-05-03 DIAGNOSIS — E876 Hypokalemia: Secondary | ICD-10-CM

## 2020-05-03 DIAGNOSIS — M3500 Sicca syndrome, unspecified: Secondary | ICD-10-CM

## 2020-05-03 DIAGNOSIS — M79606 Pain in leg, unspecified: Secondary | ICD-10-CM

## 2020-05-03 DIAGNOSIS — R7982 Elevated C-reactive protein (CRP): Secondary | ICD-10-CM

## 2020-05-03 DIAGNOSIS — M48061 Spinal stenosis, lumbar region without neurogenic claudication: Secondary | ICD-10-CM

## 2020-05-03 DIAGNOSIS — I7090 Unspecified atherosclerosis: Secondary | ICD-10-CM

## 2020-05-03 DIAGNOSIS — E785 Hyperlipidemia, unspecified: Secondary | ICD-10-CM

## 2020-05-03 DIAGNOSIS — M797 Fibromyalgia: Secondary | ICD-10-CM

## 2020-05-03 DIAGNOSIS — M25569 Pain in unspecified knee: Secondary | ICD-10-CM

## 2020-05-03 DIAGNOSIS — M4802 Spinal stenosis, cervical region: Secondary | ICD-10-CM

## 2020-05-03 DIAGNOSIS — F32A Depression: Secondary | ICD-10-CM

## 2020-05-03 DIAGNOSIS — M791 Myalgia, unspecified site: Secondary | ICD-10-CM

## 2020-05-03 DIAGNOSIS — D751 Secondary polycythemia: Secondary | ICD-10-CM

## 2020-05-03 DIAGNOSIS — M47812 Spondylosis without myelopathy or radiculopathy, cervical region: Secondary | ICD-10-CM

## 2020-05-03 DIAGNOSIS — M25511 Pain in right shoulder: Secondary | ICD-10-CM

## 2020-05-03 DIAGNOSIS — G8929 Other chronic pain: Secondary | ICD-10-CM

## 2020-05-03 DIAGNOSIS — M1990 Unspecified osteoarthritis, unspecified site: Secondary | ICD-10-CM

## 2020-05-03 DIAGNOSIS — R739 Hyperglycemia, unspecified: Secondary | ICD-10-CM

## 2020-05-03 DIAGNOSIS — M5126 Other intervertebral disc displacement, lumbar region: Secondary | ICD-10-CM

## 2020-05-03 DIAGNOSIS — M5136 Other intervertebral disc degeneration, lumbar region: Secondary | ICD-10-CM

## 2020-05-03 DIAGNOSIS — M5020 Other cervical disc displacement, unspecified cervical region: Secondary | ICD-10-CM

## 2020-05-03 DIAGNOSIS — M549 Dorsalgia, unspecified: Secondary | ICD-10-CM

## 2020-05-03 DIAGNOSIS — M2550 Pain in unspecified joint: Secondary | ICD-10-CM

## 2020-05-03 DIAGNOSIS — M542 Cervicalgia: Secondary | ICD-10-CM

## 2020-05-03 DIAGNOSIS — M47816 Spondylosis without myelopathy or radiculopathy, lumbar region: Secondary | ICD-10-CM

## 2020-05-03 DIAGNOSIS — F909 Attention-deficit hyperactivity disorder, unspecified type: Secondary | ICD-10-CM

## 2020-05-03 DIAGNOSIS — F1190 Opioid use, unspecified, uncomplicated: Secondary | ICD-10-CM

## 2020-05-03 DIAGNOSIS — M5030 Other cervical disc degeneration, unspecified cervical region: Secondary | ICD-10-CM

## 2020-05-03 MED ORDER — LEFLUNOMIDE 20 MG PO TAB
20 mg | ORAL_TABLET | Freq: Every day | ORAL | 1 refills | 90.00000 days | Status: AC
Start: 2020-05-03 — End: ?

## 2020-05-03 NOTE — Progress Notes
Kathleen Steele is a 50 year old female here for a follow-up visit      Chief complaint: Arthralgia      Interval history:  Last seen November at which time we added leflunomide to her regimen of prednisone 5 mg daily and hydroxychloroquine, both of which have been helpful in managing her inflammatory arthritis, she tells me that she tolerated therapy with leflunomide without side effects and reports about 40% provement of her joint pain, stiffness and swelling, but continues to report about 1 hour of morning stiffness and discomfort in her hands particularly the right 1, she was in [place] in December developed severe photosensitive rash when exposed to the sun despite using SPF 50 sunscreen, the rash lasted for 10 days and responded over time to a topical steroids, she also describes Raynaud's phenomenon recurring once or twice per day  She has been under a great deal of response to stress due to the loss of several family members to suicide and COVID-19 infection, her laboratory testing earlier this month showed unremarkable CBC and CMP,  She showed me some recent work-up done at [facility] showing a normal EMG except for S1 radiculopathy but tells me that the skin biopsy recently revealed a small fiber neuropathy          Past Medical History:  Reviewed in O2    Past surgical History:  Reviewed in O2    Family History:  Family history reviewed in the medical records, cousin with SLE       Social History:  Reviewed in O2      Allergies:   Reviewed in the records      Current medication list:  Reviewed       Physical Examination:    BP 122/70 (BP Source: Arm, Right Upper, Patient Position: Sitting)  - Pulse 102  - Temp 36.8 ?C (98.2 ?F) (Temporal)  - Resp 18  - Ht 160 cm (5' 3)  - Wt 88 kg (194 lb)  - SpO2 98%  - BMI 34.37 kg/m?      GENERAL APPEARANCE: The patient is a well-nourished adult in no acute distress, very pleasant    Musculoskeletal exam revealed tenderness over many of her MCPs bilaterally        Five minute Schirmer's test without anesthesia:    OS: 13 mm  OD: 20 mm       Assessment:    1.  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  Fibromyalgia syndrome, established diagnosis, tried several  pharmacological therapies, she is asking about low-dose naltrexone, we previously discussed that this may be good idea but she will have the first withdrawal oxycodone    3.  History of severe generalized osteoarthritis, she is status post laminectomy of the lumbar spine, potentially she may need similar procedure in the cervical spine per the history, she also had replacement of the first [facility] bilaterally    4.  Right hip pain, the notes suggest history of impingement, surgical evaluation is pending neurosurgery evaluation for her spine pain and the need for additional surgical interventions    5.  Low vitamin D, she started repletion last visit, will recheck levels today    Plan:  Increase leflunomide to 20 mg daily  Continue hydroxychloroquine 400 mg daily and prednisone 5 mg daily  Monthly CBC and CMP    Return to the clinic in 4 months     Leverne Humbles, MD    Because this dictation was prepared with voice recognition software, there remains a potential for typographical errors or incorrect word choices by the system. We apologize for any inadvertent inconvenience from such an error.

## 2020-05-03 NOTE — Patient Instructions
First we will double the dose of leflunomide to 20 mg daily, please let me know if you experience any    Second, we will keep you on the same dose of prednisone 5 mg daily for the time being, however the goal in the long-term however is to taper the dose slowly until off, this will be hopefully achieved once you are better controlled in terms of the arthritis    Third, you should remain on the same dose of hydroxychloroquine    Central warming is very essential to prevent future Raynaud's however in the long-term we may consider pharmacological therapies if you do not respond initially    Continue to obtain monthly CBC and CMP and I will see you back in about 3 to 49-month

## 2020-05-04 DIAGNOSIS — M359 Systemic involvement of connective tissue, unspecified: Principal | ICD-10-CM

## 2020-05-25 ENCOUNTER — Encounter: Admit: 2020-05-25 | Discharge: 2020-05-25 | Payer: MEDICARE

## 2020-05-25 DIAGNOSIS — M359 Systemic involvement of connective tissue, unspecified: Secondary | ICD-10-CM

## 2020-06-23 ENCOUNTER — Encounter: Admit: 2020-06-23 | Discharge: 2020-06-23 | Payer: MEDICARE

## 2020-06-23 DIAGNOSIS — M359 Systemic involvement of connective tissue, unspecified: Secondary | ICD-10-CM

## 2020-07-22 ENCOUNTER — Encounter: Admit: 2020-07-22 | Discharge: 2020-07-22 | Payer: MEDICARE

## 2020-07-22 DIAGNOSIS — M359 Systemic involvement of connective tissue, unspecified: Secondary | ICD-10-CM

## 2020-08-18 ENCOUNTER — Encounter: Admit: 2020-08-18 | Discharge: 2020-08-18 | Payer: MEDICARE

## 2020-08-18 DIAGNOSIS — M359 Systemic involvement of connective tissue, unspecified: Secondary | ICD-10-CM

## 2020-08-18 LAB — CBC AND DIFF

## 2020-08-18 LAB — COMPREHENSIVE METABOLIC PANEL

## 2020-08-18 NOTE — Telephone Encounter
Discussed with Dr Cecelia Byars.     40 meq today and then tomorrow, and pt to call PCP office regarding potassium.          Called patient and discussed low potassium. She reports recent gi sickness with diarrhea.     Discussed taking 40 meq today and tomorrow, and following up with her PCP regarding the low potassium and she stated understanding. Denied any needs and she has enough potassium on hand to take the additional doses.         Admin keeping an eye out for result fax.

## 2020-09-27 ENCOUNTER — Encounter: Admit: 2020-09-27 | Discharge: 2020-09-27 | Payer: MEDICARE

## 2020-09-30 ENCOUNTER — Ambulatory Visit: Admit: 2020-09-30 | Discharge: 2020-10-01 | Payer: MEDICARE

## 2020-09-30 ENCOUNTER — Encounter: Admit: 2020-09-30 | Discharge: 2020-09-30 | Payer: MEDICARE

## 2020-09-30 DIAGNOSIS — F1190 Opioid use, unspecified, uncomplicated: Secondary | ICD-10-CM

## 2020-09-30 DIAGNOSIS — R739 Hyperglycemia, unspecified: Secondary | ICD-10-CM

## 2020-09-30 DIAGNOSIS — M542 Cervicalgia: Secondary | ICD-10-CM

## 2020-09-30 DIAGNOSIS — M5030 Other cervical disc degeneration, unspecified cervical region: Secondary | ICD-10-CM

## 2020-09-30 DIAGNOSIS — M47812 Spondylosis without myelopathy or radiculopathy, cervical region: Secondary | ICD-10-CM

## 2020-09-30 DIAGNOSIS — E785 Hyperlipidemia, unspecified: Secondary | ICD-10-CM

## 2020-09-30 DIAGNOSIS — M549 Dorsalgia, unspecified: Secondary | ICD-10-CM

## 2020-09-30 DIAGNOSIS — M5136 Other intervertebral disc degeneration, lumbar region: Secondary | ICD-10-CM

## 2020-09-30 DIAGNOSIS — M797 Fibromyalgia: Secondary | ICD-10-CM

## 2020-09-30 DIAGNOSIS — R7982 Elevated C-reactive protein (CRP): Secondary | ICD-10-CM

## 2020-09-30 DIAGNOSIS — M48061 Spinal stenosis, lumbar region without neurogenic claudication: Secondary | ICD-10-CM

## 2020-09-30 DIAGNOSIS — F909 Attention-deficit hyperactivity disorder, unspecified type: Secondary | ICD-10-CM

## 2020-09-30 DIAGNOSIS — F32A Depression: Secondary | ICD-10-CM

## 2020-09-30 DIAGNOSIS — M1990 Unspecified osteoarthritis, unspecified site: Secondary | ICD-10-CM

## 2020-09-30 DIAGNOSIS — M109 Gout, unspecified: Secondary | ICD-10-CM

## 2020-09-30 DIAGNOSIS — M79606 Pain in leg, unspecified: Secondary | ICD-10-CM

## 2020-09-30 DIAGNOSIS — M4802 Spinal stenosis, cervical region: Secondary | ICD-10-CM

## 2020-09-30 DIAGNOSIS — I7090 Unspecified atherosclerosis: Secondary | ICD-10-CM

## 2020-09-30 DIAGNOSIS — E876 Hypokalemia: Secondary | ICD-10-CM

## 2020-09-30 DIAGNOSIS — G8929 Other chronic pain: Secondary | ICD-10-CM

## 2020-09-30 DIAGNOSIS — M791 Myalgia, unspecified site: Secondary | ICD-10-CM

## 2020-09-30 DIAGNOSIS — M47816 Spondylosis without myelopathy or radiculopathy, lumbar region: Secondary | ICD-10-CM

## 2020-09-30 DIAGNOSIS — D751 Secondary polycythemia: Secondary | ICD-10-CM

## 2020-09-30 DIAGNOSIS — M25569 Pain in unspecified knee: Secondary | ICD-10-CM

## 2020-09-30 DIAGNOSIS — M2550 Pain in unspecified joint: Secondary | ICD-10-CM

## 2020-09-30 DIAGNOSIS — M25511 Pain in right shoulder: Secondary | ICD-10-CM

## 2020-09-30 DIAGNOSIS — M3500 Sicca syndrome, unspecified: Secondary | ICD-10-CM

## 2020-09-30 MED ORDER — HYDROXYCHLOROQUINE 200 MG PO TAB
400 mg | ORAL_TABLET | Freq: Every day | ORAL | 1 refills | 90.00000 days | Status: AC
Start: 2020-09-30 — End: ?

## 2020-09-30 MED ORDER — LEFLUNOMIDE 10 MG PO TAB
ORAL_TABLET | ORAL | 3 refills | 90.00000 days | Status: AC
Start: 2020-09-30 — End: ?

## 2020-09-30 MED ORDER — LEFLUNOMIDE 20 MG PO TAB
ORAL_TABLET | ORAL | 3 refills | 90.00000 days | Status: AC
Start: 2020-09-30 — End: ?

## 2020-09-30 NOTE — Progress Notes
Kathleen Steele is a 50 year old female here for a follow-up visit      Chief complaint: Arthralgia      Interval history:  Seen in February at which time we increased her leflunomide dose to 20 mg daily, in May she called reporting GI disturbances with nausea vomiting and worsening diarrhea compared to her baseline of diarrhea that she has had since 2005  Her labs revealed hypokalemia which was replaced  She now tells me that she will undergo cervical spine fusion sometime in August, followed by surgery on her back  She has been compliant with hydroxychloroquine and leflunomide  With a higher dose of leflunomide she significantly feels better in terms of her joint pain, or stiffness in the morning now is much briefer lasting for no more than 15 minutes, the joint pain is improved in her hands      Hemoglobin was recently found to be mildly elevated, it was 16.5 on recent testing, no history of smoking and she was told she may have chronic lung disease, no prior diagnosis of OSA      Past Medical History:  Reviewed in O2    Past surgical History:  Reviewed in O2    Family History:  Family history reviewed in the medical records, cousin with SLE       Social History:  Reviewed in O2      Allergies:   Reviewed in the records      Current medication list:  Reviewed       Physical Examination:    BP 115/79 (BP Source: Arm, Right Upper, Patient Position: Sitting)  - Pulse 97  - Temp 36.6 ?C (97.9 ?F) (Temporal)  - Resp 16  - Ht 160 cm (5' 3)  - Wt 80.3 kg (177 lb)  - SpO2 97%  - BMI 31.35 kg/m?      GENERAL APPEARANCE: The patient is a well-nourished adult in no acute distress, very pleasant      Five minute Schirmer's test without anesthesia:    OS: 13 mm  OD: 20 mm       Assessment:    1.  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, the diagnosis initially made around 2014     The symptoms include severe sicca symptoms, abnormal eye exam requiring punctal plugs and cauterization,  use of pilocarpine for dry mouth, severe photosensitivity, Recent Raynaud's Phenomenon, positive SSB and ANA per the report, and biopsy-proven small fiber neuropathy    Serological testing at [place] revealed low titer positive SSB, a minor salivary gland  biopsy was negative    I diagnosed undifferentiated connective tissue disease with features of Sjogren's, we added leflunomide in November 2021 to her regimen of hydroxychloroquine, due to active disease at the dose of leflunomide was increased in February to 20 mg daily, she is clearly feeling better with significant improvement of her inflammatory joint pain however this was also associated with increased nausea, intermittent vomiting and worsening baseline diarrhea  I suggested that we lower the dose to 15 mg daily by alternating 10 and 20 mg doses every other day  She will undergo cervical spine fusion sometime in August and given that, she will hold leflunomide immediately will remain on the same dose of hydroxychloroquine  She may require a short course of prednisone if her symptoms worsen while off leflunomide    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  Fibromyalgia syndrome, established diagnosis, tried several pharmacological therapies, she is asking about low-dose naltrexone, we previously discussed that this may be good idea but she will have the first withdrawal oxycodone    3.  History of severe generalized osteoarthritis, she is status post laminectomy of the lumbar spine, potentially she may need similar procedure in the cervical spine per the history, she also had replacement of the first [facility] bilaterally    4.  Right hip pain, the notes suggest history of impingement, previously evaluated by surgery service    5.  Low vitamin D, she started repletion last visit, will recheck levels today    Plan:    Discontinue leflunomide given the upcoming surgery  Only resume therapy once allowed by your surgeon and let me know  Once you resume therapy you will try a lower dose, 15 mg daily by alternating the 10 and 20 mg every other day  Labs to be done 2 months after resuming leflunomide  Continue hydroxychloroquine 400 mg daily   Return to the clinic in 4 months     Leverne Humbles, MD    Because this dictation was prepared with voice recognition software, there remains a potential for typographical errors or incorrect word choices by the system. We apologize for any inadvertent inconvenience from such an error.

## 2021-01-13 ENCOUNTER — Encounter: Admit: 2021-01-13 | Discharge: 2021-01-13 | Payer: MEDICARE

## 2021-01-13 NOTE — Telephone Encounter
Eye exam received via [employer], document added to image now to be scanned into EMR

## 2021-02-22 ENCOUNTER — Encounter: Admit: 2021-02-22 | Discharge: 2021-02-22 | Payer: MEDICARE

## 2021-02-22 ENCOUNTER — Ambulatory Visit: Admit: 2021-02-22 | Discharge: 2021-02-22 | Payer: MEDICARE

## 2021-02-22 DIAGNOSIS — M4802 Spinal stenosis, cervical region: Secondary | ICD-10-CM

## 2021-02-22 DIAGNOSIS — M79606 Pain in leg, unspecified: Secondary | ICD-10-CM

## 2021-02-22 DIAGNOSIS — F909 Attention-deficit hyperactivity disorder, unspecified type: Secondary | ICD-10-CM

## 2021-02-22 DIAGNOSIS — E876 Hypokalemia: Secondary | ICD-10-CM

## 2021-02-22 DIAGNOSIS — M47812 Spondylosis without myelopathy or radiculopathy, cervical region: Secondary | ICD-10-CM

## 2021-02-22 DIAGNOSIS — M1990 Unspecified osteoarthritis, unspecified site: Principal | ICD-10-CM

## 2021-02-22 DIAGNOSIS — M791 Myalgia, unspecified site: Secondary | ICD-10-CM

## 2021-02-22 DIAGNOSIS — M549 Dorsalgia, unspecified: Secondary | ICD-10-CM

## 2021-02-22 DIAGNOSIS — M797 Fibromyalgia: Secondary | ICD-10-CM

## 2021-02-22 DIAGNOSIS — M25511 Pain in right shoulder: Secondary | ICD-10-CM

## 2021-02-22 DIAGNOSIS — M25569 Pain in unspecified knee: Secondary | ICD-10-CM

## 2021-02-22 DIAGNOSIS — R7982 Elevated C-reactive protein (CRP): Secondary | ICD-10-CM

## 2021-02-22 DIAGNOSIS — I7090 Unspecified atherosclerosis: Secondary | ICD-10-CM

## 2021-02-22 DIAGNOSIS — R739 Hyperglycemia, unspecified: Secondary | ICD-10-CM

## 2021-02-22 DIAGNOSIS — M2550 Pain in unspecified joint: Secondary | ICD-10-CM

## 2021-02-22 DIAGNOSIS — M5030 Other cervical disc degeneration, unspecified cervical region: Secondary | ICD-10-CM

## 2021-02-22 DIAGNOSIS — M5136 Other intervertebral disc degeneration, lumbar region: Secondary | ICD-10-CM

## 2021-02-22 DIAGNOSIS — M47816 Spondylosis without myelopathy or radiculopathy, lumbar region: Secondary | ICD-10-CM

## 2021-02-22 DIAGNOSIS — M48061 Spinal stenosis, lumbar region without neurogenic claudication: Secondary | ICD-10-CM

## 2021-02-22 DIAGNOSIS — E785 Hyperlipidemia, unspecified: Secondary | ICD-10-CM

## 2021-02-22 DIAGNOSIS — D751 Secondary polycythemia: Secondary | ICD-10-CM

## 2021-02-22 DIAGNOSIS — M109 Gout, unspecified: Secondary | ICD-10-CM

## 2021-02-22 DIAGNOSIS — F32A Depression: Secondary | ICD-10-CM

## 2021-02-22 DIAGNOSIS — M542 Cervicalgia: Secondary | ICD-10-CM

## 2021-02-22 DIAGNOSIS — M3500 Sicca syndrome, unspecified: Secondary | ICD-10-CM

## 2021-02-22 DIAGNOSIS — F1190 Opioid use, unspecified, uncomplicated: Secondary | ICD-10-CM

## 2021-02-22 DIAGNOSIS — G8929 Other chronic pain: Secondary | ICD-10-CM

## 2021-02-22 MED ORDER — HYDROXYCHLOROQUINE 200 MG PO TAB
400 mg | ORAL_TABLET | Freq: Every day | ORAL | 2 refills | 90.00000 days | Status: AC
Start: 2021-02-22 — End: ?

## 2021-02-22 MED ORDER — HYDROXYCHLOROQUINE 200 MG PO TAB
400 mg | ORAL_TABLET | Freq: Every day | ORAL | 2 refills | 90.00000 days | Status: DC
Start: 2021-02-22 — End: 2021-02-22

## 2021-02-22 MED ORDER — RXAMB NALTREXONE 1.5 MG ORAL CAPSULE (COMPOUND)
1.5 mg | ORAL_CAPSULE | Freq: Every day | ORAL | 1 refills | Status: AC
Start: 2021-02-22 — End: ?
  Filled 2021-02-25: qty 0.05, 30d supply, fill #1

## 2021-02-22 MED ORDER — RXAMB NALTREXONE 1.5 MG ORAL CAPSULE (COMPOUND)
1.5 mg | ORAL_CAPSULE | Freq: Every day | ORAL | 1 refills | Status: DC
Start: 2021-02-22 — End: 2021-02-22

## 2021-02-22 NOTE — Patient Instructions
It was pleasure to see you again M sorry for what you have been through, at this point in time we really cannot start you back on leflunomide until after your upcoming surgery, that said please discuss using long-term dose of prednisone between 5 and 10 mg with your surgeon, what ever he decides well trumps my opinion since this will determine the risk of complications postoperatively  Short-term, I suggest that we start you on naltrexone since you are no longer using any narcotics, this can be safely used perioperatively as well  Will start a 1.5 mg daily dose and we certainly can use higher doses so please call me after 3 or 4 weeks if you would like me to push the dose higher  We will assess you back in about 3 months

## 2021-02-22 NOTE — Telephone Encounter
Patient requesting refill of hcq and for naltrexone script to be sent to [facility] pharmacy for delivery.    Per LOV 02/22/21 "Start naltrexone 1.5 mg daily  Discuss the use of small dose prednisone with your surgeon  Continue hydroxychloroquine 400 mg daily"    Follow up scheduled for 08/30/21.    Eye exam completed 12/21/20.    Refilled per protocol.

## 2021-02-22 NOTE — Progress Notes
Of theCarol is a 50 year old female here for a follow-up visit      Chief complaint: Arthralgia      Interval history:  Last seen in July at which time she told me that she was scheduled later in the summer to undergo a cervical spine which she had come off 2 weeks later she resumed leflunomide unfortunately a few days after that she fell and tells me that she had malfunction of the hardware for which she will need another surgery, this has not been set up yet and she has been dealing with increasing inflammatory joint pain since she has not been back on leflunomide after that as she was advised to stop therapy by her surgeon, she was on oxycodone in the past but has not had that refilled lately, with her ongoing pain, we discussed that we can start her naltrexone to elevate some of her symptoms      Past Medical History:  Reviewed in O2    Past surgical History:  Reviewed in O2    Family History:  Family history reviewed in the medical records, cousin with SLE       Social History:  Reviewed in O2      Allergies:   Reviewed in the records      Current medication list:  Reviewed       Physical Examination:    BP 117/75 (BP Source: Arm, Right Upper, Patient Position: Sitting)  - Pulse 99  - Temp 36.7 ?C (98.1 ?F) (Temporal)  - Resp 16  - Ht 160 cm (5' 3)  - Wt 80.3 kg (177 lb)  - SpO2 99%  - BMI 31.35 kg/m?    GENERAL APPEARANCE: The patient is a well-nourished adult in no acute distress, very pleasant      Five minute Schirmer's test without anesthesia:    OS: 13 mm  OD: 20 mm       Assessment:    1.  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, the diagnosis initially made around 2014     The symptoms include severe sicca symptoms, abnormal eye exam requiring punctal plugs and cauterization,  use of pilocarpine for dry mouth, severe photosensitivity, Recent Raynaud's Phenomenon, positive SSB and ANA per the report, and biopsy-proven small fiber neuropathy    Serological testing at [place] revealed low titer positive SSB, a minor salivary gland  biopsy was negative    I diagnosed undifferentiated connective tissue disease with features of Sjogren's, we added leflunomide in November 2021    Leflunomide led to significant improvement of her symptoms and she felt a lot worse after therapy was discontinued in the summer in the context of cervical spine fusion, unfortunately she has been dealing with malfunction of the hardware following a fall in the summer that would require another surgical intervention, at this point in time we cannot utilize leflunomide until these issues are settled, short-term I suggest that she speaks with her surgeon to see if he would allow her to use a small dose of prednisone between 5 and 10 mg, as she was previously using oxycodone which she has not been receiving lately and I suggested that we put her on naltrexone at this point to manage her musculoskeletal pain, she agreed to that and we will start her on 1.5 mg daily, she will call the office after 3 weeks to report if she requires a higher dose    2.  Fibromyalgia syndrome, established diagnosis, tried several pharmacological  therapies, she is asking about low-dose naltrexone, we previously discussed that this may be good idea but she will have the first withdrawal oxycodone    3.  History of severe generalized osteoarthritis, she is status post laminectomy of the lumbar spine, potentially she may need similar procedure in the cervical spine per the history, she also had replacement of the first [facility] bilaterally    4.  Right hip pain, the notes suggest history of impingement, previously evaluated by surgery service    5.  Low vitamin D, she started repletion last visit, will recheck levels today    Plan:  Start naltrexone 1.5 mg daily  Discuss the use of small dose prednisone with your surgeon  Continue hydroxychloroquine 400 mg daily     Return to the clinic in 4 months     Leverne Humbles, MD    Because this dictation was prepared with voice recognition software, there remains a potential for typographical errors or incorrect word choices by the system. We apologize for any inadvertent inconvenience from such an error.

## 2021-02-23 ENCOUNTER — Encounter: Admit: 2021-02-23 | Discharge: 2021-02-23 | Payer: MEDICARE

## 2021-02-24 ENCOUNTER — Encounter: Admit: 2021-02-24 | Discharge: 2021-02-24 | Payer: MEDICARE

## 2021-02-25 ENCOUNTER — Encounter: Admit: 2021-02-25 | Discharge: 2021-02-25 | Payer: MEDICARE

## 2021-03-21 ENCOUNTER — Encounter: Admit: 2021-03-21 | Discharge: 2021-03-21 | Payer: MEDICARE

## 2021-03-22 ENCOUNTER — Encounter: Admit: 2021-03-22 | Discharge: 2021-03-22 | Payer: MEDICARE

## 2021-03-22 MED FILL — RXAMB NALTREXONE 3 MG ORAL CAPSULE (COMPOUND): ORAL | 30 days supply | Qty: 0.09 | Fill #1 | Status: AC

## 2021-03-24 ENCOUNTER — Encounter: Admit: 2021-03-24 | Discharge: 2021-03-24 | Payer: MEDICARE

## 2021-03-24 NOTE — Telephone Encounter
Called patient, informed her of Dr. Zackery Barefoot recommendation to hold leflunomide for 2 weeks prior to surgery, she may resume post-operatively under the guidance of Dr. Joannie Springs. Advised to continue Plaquenil and she may skip dose day of surgery. Advised to hold LDN if there are plans for post-op narcotics, patient verbalized understanding of all of the above. Signed surgery recommendation form faxed to Dr. Christ Kick office, [phone]. Confirmation fax received.

## 2021-04-15 ENCOUNTER — Encounter: Admit: 2021-04-15 | Discharge: 2021-04-15 | Payer: MEDICARE

## 2021-05-26 ENCOUNTER — Other Ambulatory Visit: Payer: Self-pay | Admitting: Family Medicine

## 2021-05-27 NOTE — Telephone Encounter (Signed)
Please close

## 2021-05-27 NOTE — Telephone Encounter (Signed)
Pharm sent

## 2021-06-03 ENCOUNTER — Encounter: Admit: 2021-06-03 | Discharge: 2021-06-03 | Payer: MEDICARE

## 2021-08-30 ENCOUNTER — Encounter: Admit: 2021-08-30 | Discharge: 2021-08-30 | Payer: MEDICARE

## 2021-08-30 ENCOUNTER — Ambulatory Visit: Admit: 2021-08-30 | Discharge: 2021-08-31 | Payer: MEDICARE

## 2021-08-30 DIAGNOSIS — G8929 Other chronic pain: Secondary | ICD-10-CM

## 2021-08-30 DIAGNOSIS — E785 Hyperlipidemia, unspecified: Secondary | ICD-10-CM

## 2021-08-30 DIAGNOSIS — F909 Attention-deficit hyperactivity disorder, unspecified type: Secondary | ICD-10-CM

## 2021-08-30 DIAGNOSIS — M79606 Pain in leg, unspecified: Secondary | ICD-10-CM

## 2021-08-30 DIAGNOSIS — F1190 Opioid use, unspecified, uncomplicated: Secondary | ICD-10-CM

## 2021-08-30 DIAGNOSIS — I7090 Unspecified atherosclerosis: Secondary | ICD-10-CM

## 2021-08-30 DIAGNOSIS — M1990 Unspecified osteoarthritis, unspecified site: Secondary | ICD-10-CM

## 2021-08-30 DIAGNOSIS — D751 Secondary polycythemia: Secondary | ICD-10-CM

## 2021-08-30 DIAGNOSIS — M4802 Spinal stenosis, cervical region: Secondary | ICD-10-CM

## 2021-08-30 DIAGNOSIS — M25569 Pain in unspecified knee: Secondary | ICD-10-CM

## 2021-08-30 DIAGNOSIS — M48061 Spinal stenosis, lumbar region without neurogenic claudication: Secondary | ICD-10-CM

## 2021-08-30 DIAGNOSIS — Z7969 Long term (current) use of other immunomodulators and immunosuppressants: Secondary | ICD-10-CM

## 2021-08-30 DIAGNOSIS — M5030 Other cervical disc degeneration, unspecified cervical region: Secondary | ICD-10-CM

## 2021-08-30 DIAGNOSIS — M549 Dorsalgia, unspecified: Secondary | ICD-10-CM

## 2021-08-30 DIAGNOSIS — M5136 Other intervertebral disc degeneration, lumbar region: Secondary | ICD-10-CM

## 2021-08-30 DIAGNOSIS — M3500 Sicca syndrome, unspecified: Secondary | ICD-10-CM

## 2021-08-30 DIAGNOSIS — M47812 Spondylosis without myelopathy or radiculopathy, cervical region: Secondary | ICD-10-CM

## 2021-08-30 DIAGNOSIS — M542 Cervicalgia: Secondary | ICD-10-CM

## 2021-08-30 DIAGNOSIS — M791 Myalgia, unspecified site: Secondary | ICD-10-CM

## 2021-08-30 DIAGNOSIS — R739 Hyperglycemia, unspecified: Secondary | ICD-10-CM

## 2021-08-30 DIAGNOSIS — M2550 Pain in unspecified joint: Secondary | ICD-10-CM

## 2021-08-30 DIAGNOSIS — M109 Gout, unspecified: Secondary | ICD-10-CM

## 2021-08-30 DIAGNOSIS — M25511 Pain in right shoulder: Secondary | ICD-10-CM

## 2021-08-30 DIAGNOSIS — R7982 Elevated C-reactive protein (CRP): Secondary | ICD-10-CM

## 2021-08-30 DIAGNOSIS — F32A Depression: Secondary | ICD-10-CM

## 2021-08-30 DIAGNOSIS — M797 Fibromyalgia: Secondary | ICD-10-CM

## 2021-08-30 DIAGNOSIS — E876 Hypokalemia: Secondary | ICD-10-CM

## 2021-08-30 DIAGNOSIS — M47816 Spondylosis without myelopathy or radiculopathy, lumbar region: Secondary | ICD-10-CM

## 2021-08-30 LAB — IMMUNOGLOBULINS-IGA,IGG,IGM
IGA: 240 mg/dL (ref 70–390)
IGG: 653 mg/dL — ABNORMAL LOW (ref 762–1488)
IGM: 31 mg/dL — ABNORMAL LOW (ref 38–328)

## 2021-08-30 LAB — CBC AND DIFF
ABSOLUTE BASO COUNT: 0 K/UL (ref 0–0.20)
ABSOLUTE EOS COUNT: 0.1 K/UL (ref 0–0.45)
ABSOLUTE MONO COUNT: 0.4 K/UL (ref 0–0.80)
EOSINOPHILS %: 2 % (ref >60)
HEMATOCRIT: 40 % (ref 36–45)
LYMPHOCYTES %: 19 % — ABNORMAL LOW (ref >60)
MCH: 30 pg (ref 26–34)
MONOCYTES %: 6 % (ref 4–12)
MPV: 7.9 FL (ref 7–11)
NEUTROPHILS %: 72 % — ABNORMAL HIGH (ref 41–77)
RBC COUNT: 4.6 M/UL — ABNORMAL HIGH (ref 4.0–5.0)
RDW: 14 % (ref 11–15)
WBC COUNT: 8.1 K/UL (ref 4.5–11.0)

## 2021-08-30 LAB — C4 COMPLEMENT 4: C4: 51 mg/dL — ABNORMAL HIGH (ref 10–49)

## 2021-08-30 LAB — C3 COMPLEMENT 3: C3: 161 mg/dL (ref 88–200)

## 2021-08-30 LAB — C REACTIVE PROTEIN (CRP): C-REACTIVE PROTEIN: 0 mg/dL (ref <1.0)

## 2021-08-30 LAB — COMPREHENSIVE METABOLIC PANEL
POTASSIUM: 2.9 MMOL/L — ABNORMAL LOW (ref 3.5–5.1)
SODIUM: 142 MMOL/L (ref 137–147)

## 2021-08-30 LAB — SED RATE: ESR: 8 mm/h (ref 0–30)

## 2021-08-30 MED ORDER — LEFLUNOMIDE 20 MG PO TAB
ORAL_TABLET | ORAL | 3 refills | 90.00000 days | Status: AC
Start: 2021-08-30 — End: ?

## 2021-08-30 NOTE — Patient Instructions
Is a pleasure to see you again please obtain if you have any questions  As discussed today I am glad that you are feeling better on leflunomide but suggest that we maxed out on the dose given the residual symptoms you are dealing with particularly after you felt worse following at the lower dose  I sent a new order to the pharmacy  Labs are to be performed today then every 2 months  For toxicity monitoring  We will consider resuming naltrexone moving forward in light of the progress of your disease  Please let me know if there is any significant change to your health and I will see you back in about 6 months meanwhile, please continue hydroxychloroquine without interruption with annual eye exam please

## 2021-08-30 NOTE — Progress Notes
Kathleen Steele is a 51 year old female here for a follow-up visit      Chief complaint: Arthralgia      Interval history:  Seen in December at which time we continued hydroxychloroquine and we tried naltrexone, she called the office in general reporting hallucination which she attributed to naltrexone we asked her to stop therapy however she now tells me that she was then dealing with life stressors related to her husband leaving her, she is going through divorce now, and reports that the hallucination may have been related to the stress she was dealing with, she may consider resuming naltrexone if necessary  She since remained on leflunomide and average dose of 15 mg daily and decided not to proceed with the recommended back surgery, her pain has improved since she continued leflunomide but continues to deal with some inflammatory joint pain of the hands with some swelling, she notes that when she was on the higher dose of leflunomide she was feeling better  She has been compliant with hydroxychloroquine her next eye examination is scheduled in the fall 2023      Past Medical History:  Reviewed in O2    Past surgical History:  Reviewed in O2    Family History:  Family history reviewed in the medical records, cousin with SLE       Social History:  Reviewed in O2      Allergies:   Reviewed in the records      Current medication list:  Reviewed       Physical Examination:    BP 128/76 (BP Source: Arm, Right Upper, Patient Position: Sitting)  - Pulse 75  - Temp 36.6 ?C (97.9 ?F) (Temporal)  - Resp 16  - Ht 160 cm (5' 3)  - Wt 69.4 kg (153 lb)  - SpO2 97%  - BMI 27.10 kg/m?    GENERAL APPEARANCE: The patient is a well-nourished adult in no acute distress, very pleasant    Musculoskeletal exam revealed Heber[place]'s nodes bilaterally consistent with osteoarthritis, she has tenderness over many of her MCPs on exam today        Five minute Schirmer's test without anesthesia:    OS: 13 mm  OD: 20 mm       Assessment:    1. 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, the diagnosis initially made around 2014     The symptoms include severe sicca symptoms, abnormal eye exam requiring punctal plugs and cauterization,  use of pilocarpine for dry mouth, severe photosensitivity, Recent Raynaud's Phenomenon, positive SSB and ANA per the report, and biopsy-proven small fiber neuropathy  Serological testing at [place] revealed low titer positive SSB, a minor salivary gland biopsy was negative    I diagnosed undifferentiated connective tissue disease with features of Sjogren's, we added leflunomide in November 2021    Leflunomide led to significant improvement of her symptoms, we reduce the dose to 15 mg daily and now reports that her symptoms were better controlled on higher dose, on exam she has low-grade synovitis of the MCPs, we plan to increase the dose back to 20 mg daily and reminded her about the importance of obtaining blood work for toxicity monitoring every 2 months, I will repeat her autoimmune labs as well today and plan to see her back in 6 months    She previously reported hallucination with naltrexone however she now tells me that she had a great deal of stress when her husband left her in January and  she was seeing a psychiatrist, while she does not need the naltrexone now, she believes that her psychiatric symptoms were not in fact related to naltrexone, I agree with as this is not a common side effect anyway, therefore we will consider doing therapy moving forward if necessary    2.  Fibromyalgia syndrome, established diagnosis, tried several pharmacological therapies    3.  History of severe generalized osteoarthritis, she is status post laminectomy of the lumbar spine, she also had replacement of the first [facility] bilaterally    4.  Right hip pain, the notes suggest history of impingement, previously evaluated by surgery service    5.  Low vitamin D, she started repletion last visit, will recheck levels today    6.  Long term use of Hydroxychloroquine. Last exam in  Fall of 2023     Plan:  Increase leflunomide to 20 mg daily  Complement and B-cell markers, ESR and CRP  Consider naltrexone moving forward as above  Continue hydroxychloroquine 400 mg daily   Annual eye exam in the fall 2023    Return to the clinic in 6 months     Leverne Humbles, MD    Because this dictation was prepared with voice recognition software, there remains a potential for typographical errors or incorrect word choices by the system. We apologize for any inadvertent inconvenience from such an error.

## 2021-08-31 ENCOUNTER — Encounter: Admit: 2021-08-31 | Discharge: 2021-08-31 | Payer: MEDICARE

## 2021-08-31 NOTE — Telephone Encounter
-----   Message from Leverne Humbles, MD sent at 08/31/2021  1:26 PM CDT -----  Her potassium is low and needs to be replaced please have her discuss this immediately with her primary physician and send the results to the PCP

## 2021-08-31 NOTE — Telephone Encounter
Called patient, informed of low potassium. Instructed to notify PCP Dr. Remo Lipps office. Patient verbalized understanding. Lab results faxed to PCP [phone]

## 2021-10-27 ENCOUNTER — Encounter: Admit: 2021-10-27 | Discharge: 2021-10-27 | Payer: MEDICARE

## 2021-10-27 MED ORDER — HYDROXYCHLOROQUINE 200 MG PO TAB
ORAL_TABLET | ORAL | 0 refills | 90.00000 days | Status: AC
Start: 2021-10-27 — End: ?

## 2021-10-27 NOTE — Telephone Encounter
Refill of hcq requested. Patient last seen 08/30/21 and scheduled for follow-up 02/23/22.    Per LOV notes, "Plan:  Increase leflunomide to 20 mg daily  Complement and B-cell markers, ESR and CRP  Consider naltrexone moving forward as above  Continue hydroxychloroquine 400 mg daily   Annual eye exam in the fall 2023    Return to the clinic in 6 months     Dola Factor, MD."    Last eye exam 12/21/20    Refilled per rheumatology refill protocol.

## 2022-01-20 ENCOUNTER — Encounter: Admit: 2022-01-20 | Discharge: 2022-01-20 | Payer: MEDICARE

## 2022-01-20 MED ORDER — LEFLUNOMIDE 20 MG PO TAB
20 mg | ORAL_TABLET | Freq: Every day | ORAL | 0 refills
Start: 2022-01-20 — End: ?

## 2022-01-20 NOTE — Telephone Encounter
Refill of leflunomide requested. Patient last seen 08/30/21 and scheduled for follow-up 07/18/22.    Per LOV notes, "Plan:  Increase leflunomide to 20 mg daily  Complement and B-cell markers, ESR and CRP  Consider naltrexone moving forward as above  Continue hydroxychloroquine 400 mg daily   Annual eye exam in the fall 2023    Return to the clinic in 6 months     Dola Factor, MD."    Last labs 08/30/21, called and spoke with patient, she has not had labs completed since. She will have labs completed at [facility] clinic at [facility]. Lab orders faxed to [place], [phone]

## 2022-01-27 ENCOUNTER — Encounter: Admit: 2022-01-27 | Discharge: 2022-01-27 | Payer: MEDICARE

## 2022-02-10 ENCOUNTER — Encounter: Admit: 2022-02-10 | Discharge: 2022-02-10 | Payer: MEDICARE

## 2022-02-10 NOTE — Telephone Encounter
Eye exam received from Dr. Lily Lovings office, patient seen 01/17/22. Records normal, Document added to image now to be scanned into EMR

## 2022-02-13 ENCOUNTER — Encounter: Admit: 2022-02-13 | Discharge: 2022-02-13 | Payer: MEDICARE

## 2022-02-13 MED ORDER — PLAQUENIL 200 MG PO TAB
ORAL_TABLET | 3 refills
Start: 2022-02-13 — End: ?

## 2022-03-17 ENCOUNTER — Encounter: Admit: 2022-03-17 | Discharge: 2022-03-17 | Payer: MEDICARE

## 2022-03-17 ENCOUNTER — Ambulatory Visit: Admit: 2022-03-17 | Discharge: 2022-03-18 | Payer: MEDICARE

## 2022-03-17 DIAGNOSIS — M130 Polyarthritis, unspecified: Secondary | ICD-10-CM

## 2022-03-17 DIAGNOSIS — E876 Hypokalemia: Secondary | ICD-10-CM

## 2022-03-17 DIAGNOSIS — D751 Secondary polycythemia: Secondary | ICD-10-CM

## 2022-03-17 DIAGNOSIS — M47812 Spondylosis without myelopathy or radiculopathy, cervical region: Secondary | ICD-10-CM

## 2022-03-17 DIAGNOSIS — Z7969 Long term (current) use of other immunomodulators and immunosuppressants: Secondary | ICD-10-CM

## 2022-03-17 DIAGNOSIS — M797 Fibromyalgia: Secondary | ICD-10-CM

## 2022-03-17 DIAGNOSIS — M25569 Pain in unspecified knee: Secondary | ICD-10-CM

## 2022-03-17 DIAGNOSIS — F1190 Opioid use, unspecified, uncomplicated: Secondary | ICD-10-CM

## 2022-03-17 DIAGNOSIS — F32A Depression: Secondary | ICD-10-CM

## 2022-03-17 DIAGNOSIS — G8929 Other chronic pain: Secondary | ICD-10-CM

## 2022-03-17 DIAGNOSIS — M3500 Sicca syndrome, unspecified: Secondary | ICD-10-CM

## 2022-03-17 DIAGNOSIS — M47816 Spondylosis without myelopathy or radiculopathy, lumbar region: Secondary | ICD-10-CM

## 2022-03-17 DIAGNOSIS — M5030 Other cervical disc degeneration, unspecified cervical region: Secondary | ICD-10-CM

## 2022-03-17 DIAGNOSIS — M1990 Unspecified osteoarthritis, unspecified site: Secondary | ICD-10-CM

## 2022-03-17 DIAGNOSIS — R7982 Elevated C-reactive protein (CRP): Secondary | ICD-10-CM

## 2022-03-17 DIAGNOSIS — F909 Attention-deficit hyperactivity disorder, unspecified type: Secondary | ICD-10-CM

## 2022-03-17 DIAGNOSIS — M25511 Pain in right shoulder: Secondary | ICD-10-CM

## 2022-03-17 DIAGNOSIS — M79606 Pain in leg, unspecified: Secondary | ICD-10-CM

## 2022-03-17 DIAGNOSIS — M542 Cervicalgia: Secondary | ICD-10-CM

## 2022-03-17 DIAGNOSIS — R739 Hyperglycemia, unspecified: Secondary | ICD-10-CM

## 2022-03-17 DIAGNOSIS — M549 Dorsalgia, unspecified: Secondary | ICD-10-CM

## 2022-03-17 DIAGNOSIS — M791 Myalgia, unspecified site: Secondary | ICD-10-CM

## 2022-03-17 DIAGNOSIS — E559 Vitamin D deficiency, unspecified: Secondary | ICD-10-CM

## 2022-03-17 DIAGNOSIS — M5136 Other intervertebral disc degeneration, lumbar region: Secondary | ICD-10-CM

## 2022-03-17 DIAGNOSIS — M48061 Spinal stenosis, lumbar region without neurogenic claudication: Secondary | ICD-10-CM

## 2022-03-17 DIAGNOSIS — M4802 Spinal stenosis, cervical region: Secondary | ICD-10-CM

## 2022-03-17 DIAGNOSIS — I7090 Unspecified atherosclerosis: Secondary | ICD-10-CM

## 2022-03-17 DIAGNOSIS — M2550 Pain in unspecified joint: Secondary | ICD-10-CM

## 2022-03-17 DIAGNOSIS — E785 Hyperlipidemia, unspecified: Secondary | ICD-10-CM

## 2022-03-17 DIAGNOSIS — M109 Gout, unspecified: Secondary | ICD-10-CM

## 2022-03-17 LAB — CBC AND DIFF
ABSOLUTE BASO COUNT: 0 K/UL (ref 0–0.20)
ABSOLUTE EOS COUNT: 0.1 K/UL (ref 0–0.45)
EOSINOPHILS %: 3 % (ref 0–5)
HEMOGLOBIN: 16 g/dL — ABNORMAL HIGH (ref 12.0–15.0)
LYMPHOCYTES %: 38 % (ref 24–44)
MONOCYTES %: 9 % (ref 4–12)
MPV: 8.3 FL — ABNORMAL HIGH (ref 7–11)
RBC COUNT: 5.4 M/UL — ABNORMAL HIGH (ref 4.0–5.0)
WBC COUNT: 5.3 K/UL (ref 4.5–11.0)

## 2022-03-17 LAB — COMPREHENSIVE METABOLIC PANEL
ALT: 17 U/L (ref 7–56)
ANION GAP: 9 K/UL (ref 3–12)
BLD UREA NITROGEN: 8 mg/dL (ref 7–25)
CALCIUM: 10 mg/dL (ref 8.5–10.6)
CHLORIDE: 99 MMOL/L (ref 98–110)
CO2: 34 MMOL/L — ABNORMAL HIGH (ref 21–30)
CREATININE: 0.8 mg/dL (ref 0.4–1.00)
EGFR: >60 mL/min (ref >60)
GLUCOSE,PANEL: 95 mg/dL (ref 70–100)
POTASSIUM: 3.3 MMOL/L — ABNORMAL LOW (ref 3.5–5.1)
TOTAL BILIRUBIN: 0.4 mg/dL (ref 0.3–1.2)

## 2022-03-17 LAB — IRON + BINDING CAPACITY + %SAT+ FERRITIN
% SATURATION: 29 % (ref 28–42)
FERRITIN: 31 ng/mL (ref 10–200)
IRON BINDING: 490 ug/dL — ABNORMAL HIGH (ref 270–380)
IRON: 144 ug/dL (ref 50–160)

## 2022-03-17 MED ORDER — LEFLUNOMIDE 20 MG PO TAB
20 mg | ORAL_TABLET | Freq: Every day | ORAL | 0 refills | 90.00000 days | Status: AC
Start: 2022-03-17 — End: ?

## 2022-03-17 MED ORDER — HYDROXYCHLOROQUINE 200 MG PO TAB
200 mg | ORAL_TABLET | Freq: Two times a day (BID) | ORAL | 1 refills | 90.00000 days | Status: AC
Start: 2022-03-17 — End: ?

## 2022-03-17 NOTE — Progress Notes
Kathleen Steele is a 52 year old female here for a follow-up visit      Chief complaint: Arthralgia      Interval history:    Primary rheumatologist: Dr. Cecelia Byars last seen 08/30/2021 and at that time she continued to deal with some inflammatory joint pain of her hands with swelling.  Leflunomide was increased to 20 mg daily.  Labs were obtained 08/30/2021: Calcium 2.9, IgG 653, and IgM 31, otherwise unremarkable.    Remains on hydroxychloroquine 400 mg daily.  She did take leflunomide 20 mg daily short-term, as prescription was accidentally sent in for leflunomide 20 mg every other day, and then she has been stretching out the prescription, due to not being able to get labs updated or refills.  Has been without leflunomide for about 2 weeks now.  Does find it helpful and has had improvement of her joint pain and swelling.  Continues to deal with a lot of life stressors and this is why she has been unable to get her labs.  She is requesting a print out for the lab orders, as she lives far away.  Has been bruising very easily and hands have been very dry and cracking. Reviewed outside eye exam done 01/17/2022- no toxicity.  She is no longer taking narcotics and started taking some THC Gummies with benefit.    Review of Systems   HENT:  Positive for congestion, dental problem, ear pain and postnasal drip.    Eyes:  Positive for photophobia, pain, discharge, redness and itching.   Respiratory:  Positive for cough, shortness of breath and wheezing.    Gastrointestinal:  Positive for diarrhea.   Endocrine: Positive for cold intolerance, heat intolerance and polyphagia.   Genitourinary:  Positive for difficulty urinating.   Musculoskeletal:  Positive for arthralgias, back pain, gait problem, joint swelling, myalgias, neck pain and neck stiffness.   Skin:  Positive for rash.   Neurological:  Positive for weakness and numbness.   Hematological:  Positive for adenopathy. Bruises/bleeds easily.   Psychiatric/Behavioral:  Positive for dysphoric mood. The patient is nervous/anxious.    All other systems reviewed and are negative.    Past Medical History:  Reviewed in O2    Past surgical History:  Reviewed in O2    Family History:  Family history reviewed in the medical records, cousin with SLE       Social History:  Reviewed in O2      Allergies:   Reviewed in the records      Current medication list:  Reviewed       Physical Examination:    Ht 160 cm (5' 3)  - Wt 67.6 kg (149 lb)  - BMI 26.39 kg/m?    GENERAL APPEARANCE: The patient is a well-nourished adult in no acute distress, very pleasant      Gen:  AOx3, NAD  CV: RRR no murmurs noted  Pulm: CTAB, no wheeze, rales or rhonchi, no respiratory distress  Skin: Warm, well perfused, no rashes, no nodules.    MSK: No swelling/warmth of joints. Normal ROM.            Five minute Schirmer's test without anesthesia (completed on a prior visit):    OS: 13 mm  OD: 20 mm       Assessment:    1.  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 Cecelia Byars diagnosed undifferentiated connective tissue disease with features of Sjogren's, added leflunomide in November 2021    Leflunomide led to significant improvement of her symptoms, she had increased joint pain and swelling with reduced dose at 15 mg daily, so was increased back to 20 mg daily with significant improvement.  However, she only briefly was able to take daily and then has been stretching out her prescription to every other day, and has been without for 2 weeks now.  Has had a lot of life stressors and had to move, so was unable to get labs or refills.    She had hallucinations while on naltrexone and prefers to remain off.  Although, it is unclear if these hallucinations were from the naltrexone or life stressors at that time.    2.  Fibromyalgia syndrome, established diagnosis, tried several pharmacological therapies    3.  History of severe generalized osteoarthritis, she is status post laminectomy of the lumbar spine, she also had replacement of the first [facility] bilaterally    4.  Right hip pain, the notes suggest history of impingement, previously evaluated by surgery service    5.  Low vitamin D, she started repletion last visit, will recheck levels today    6.  Long term use of Hydroxychloroquine. Last exam in 01/17/2022- no toxicity     Plan:  Will restart leflunomide 20 mg daily once labs are in process   Continue hydroxychloroquine 400 mg daily   Continue annual eye exams  Labs today, monthly for 3 months, then every 3 months thereafter    Keep follow-up appointment with Dr. Cecelia Byars 07/18/2022    Marchelle Folks, APRN     Total Time Today was 39 minutes in the following activities: Preparing to see the patient, Obtaining and/or reviewing separately obtained history, Performing a medically appropriate examination and/or evaluation, Counseling and educating the patient/family/caregiver, Ordering medications, tests, or procedures, Documenting clinical information in the electronic or other health record, and Care coordination (not separately reported)

## 2022-03-21 ENCOUNTER — Encounter: Admit: 2022-03-21 | Discharge: 2022-03-21 | Payer: MEDICARE

## 2022-07-18 ENCOUNTER — Encounter: Admit: 2022-07-18 | Discharge: 2022-07-18 | Payer: MEDICARE

## 2022-07-18 ENCOUNTER — Ambulatory Visit: Admit: 2022-07-18 | Discharge: 2022-07-19 | Payer: MEDICARE

## 2022-07-18 DIAGNOSIS — M47812 Spondylosis without myelopathy or radiculopathy, cervical region: Secondary | ICD-10-CM

## 2022-07-18 DIAGNOSIS — M791 Myalgia, unspecified site: Secondary | ICD-10-CM

## 2022-07-18 DIAGNOSIS — R739 Hyperglycemia, unspecified: Secondary | ICD-10-CM

## 2022-07-18 DIAGNOSIS — M1990 Unspecified osteoarthritis, unspecified site: Secondary | ICD-10-CM

## 2022-07-18 DIAGNOSIS — M2550 Pain in unspecified joint: Secondary | ICD-10-CM

## 2022-07-18 DIAGNOSIS — Z7969 Long term (current) use of other immunomodulators and immunosuppressants: Secondary | ICD-10-CM

## 2022-07-18 DIAGNOSIS — M79606 Pain in leg, unspecified: Secondary | ICD-10-CM

## 2022-07-18 DIAGNOSIS — D751 Secondary polycythemia: Secondary | ICD-10-CM

## 2022-07-18 DIAGNOSIS — F909 Attention-deficit hyperactivity disorder, unspecified type: Secondary | ICD-10-CM

## 2022-07-18 DIAGNOSIS — M47816 Spondylosis without myelopathy or radiculopathy, lumbar region: Secondary | ICD-10-CM

## 2022-07-18 DIAGNOSIS — M109 Gout, unspecified: Secondary | ICD-10-CM

## 2022-07-18 DIAGNOSIS — M542 Cervicalgia: Secondary | ICD-10-CM

## 2022-07-18 DIAGNOSIS — M797 Fibromyalgia: Secondary | ICD-10-CM

## 2022-07-18 DIAGNOSIS — M5030 Other cervical disc degeneration, unspecified cervical region: Secondary | ICD-10-CM

## 2022-07-18 DIAGNOSIS — F1190 Opioid use, unspecified, uncomplicated: Secondary | ICD-10-CM

## 2022-07-18 DIAGNOSIS — E785 Hyperlipidemia, unspecified: Secondary | ICD-10-CM

## 2022-07-18 DIAGNOSIS — M3500 Sicca syndrome, unspecified: Secondary | ICD-10-CM

## 2022-07-18 DIAGNOSIS — E876 Hypokalemia: Secondary | ICD-10-CM

## 2022-07-18 DIAGNOSIS — R7982 Elevated C-reactive protein (CRP): Secondary | ICD-10-CM

## 2022-07-18 DIAGNOSIS — M5136 Other intervertebral disc degeneration, lumbar region: Secondary | ICD-10-CM

## 2022-07-18 DIAGNOSIS — M25511 Pain in right shoulder: Secondary | ICD-10-CM

## 2022-07-18 DIAGNOSIS — R5382 Chronic fatigue, unspecified: Secondary | ICD-10-CM

## 2022-07-18 DIAGNOSIS — M4802 Spinal stenosis, cervical region: Secondary | ICD-10-CM

## 2022-07-18 DIAGNOSIS — I7090 Unspecified atherosclerosis: Secondary | ICD-10-CM

## 2022-07-18 DIAGNOSIS — F32A Depression: Secondary | ICD-10-CM

## 2022-07-18 DIAGNOSIS — M549 Dorsalgia, unspecified: Secondary | ICD-10-CM

## 2022-07-18 DIAGNOSIS — M48061 Spinal stenosis, lumbar region without neurogenic claudication: Secondary | ICD-10-CM

## 2022-07-18 DIAGNOSIS — M25569 Pain in unspecified knee: Secondary | ICD-10-CM

## 2022-07-18 DIAGNOSIS — G8929 Other chronic pain: Secondary | ICD-10-CM

## 2022-07-18 LAB — CBC AND DIFF
ABSOLUTE BASO COUNT: 0 K/UL (ref 0–0.20)
ABSOLUTE EOS COUNT: 0.1 K/UL (ref 0–0.45)
ABSOLUTE LYMPH COUNT: 1.6 K/UL (ref 1.0–4.8)
ABSOLUTE MONO COUNT: 0.3 K/UL (ref 0–0.80)
ABSOLUTE NEUTROPHIL: 3.5 K/UL (ref 1.8–7.0)
BASOPHILS %: 1 % (ref 0–2)
EOSINOPHILS %: 2 % (ref >60)
HEMATOCRIT: 43 % (ref 36–45)
HEMOGLOBIN: 14 g/dL (ref 12.0–15.0)
LYMPHOCYTES %: 29 % (ref 24–44)
MCHC: 32 g/dL (ref 32.0–36.0)
MONOCYTES %: 6 % (ref 4–12)
MPV: 7.3 FL (ref 7–11)
NEUTROPHILS %: 62 % (ref 41–77)
PLATELET COUNT: 226 K/UL (ref 150–400)
RBC COUNT: 4.8 M/UL (ref 4.0–5.0)
WBC COUNT: 5.8 K/UL (ref 4.5–11.0)

## 2022-07-18 LAB — C REACTIVE PROTEIN (CRP): C-REACTIVE PROTEIN: 0 mg/dL (ref <1.0)

## 2022-07-18 LAB — SED RATE: ESR: 3 mm/h (ref 0–30)

## 2022-07-18 LAB — VITAMIN B12: VITAMIN B12: 156 pg/mL — ABNORMAL LOW (ref 180–914)

## 2022-07-18 LAB — COMPREHENSIVE METABOLIC PANEL
ALBUMIN: 4.1 g/dL (ref 3.5–5.0)
CALCIUM: 9.4 mg/dL (ref 8.5–10.6)
POTASSIUM: 3.8 MMOL/L (ref 3.5–5.1)
SODIUM: 144 MMOL/L (ref 137–147)
TOTAL PROTEIN: 6.7 g/dL (ref 6.0–8.0)

## 2022-07-18 LAB — TSH WITH FREE T4 REFLEX: TSH: 1.6 uU/mL (ref 0.35–5.00)

## 2022-07-18 MED ORDER — ERGOCALCIFEROL (VITAMIN D2) 1,250 MCG (50,000 UNIT) PO CAP
ORAL_CAPSULE | ORAL | 3 refills | 56.00000 days | Status: AC
Start: 2022-07-18 — End: ?

## 2022-07-18 NOTE — Progress Notes
Kathleen Steele is a 51 y.o.  female here for a follow-up visit      Chief complaint: Management of undifferentiated connective tissue disease    Interval history:  I last saw her in June 2023 at which time she was on naltrexone but reported some hallucinations and increased life stressors, as she was dealing with divorce, we held naltrexone since but consider resuming the treatment if necessary in the future  She was then advised to remain on leflunomide 20 mg daily and hydroxychloroquine, however therapy was interrupted prior to her follow-up visit with my colleague Florentina Addison in January and she was advised to resume the treatment since  Her most recent toxicity monitoring labs were performed in early January revealing unremarkable CBC and CMP. Her vitamin D was 29    She now reports that fatigue continues to be her major problem now, her joint pain is not severe and does not require any additional therapy although she is not fully controlled yet with residual pain involving her hands and some stiffness, she is not comfortable resuming naltrexone at this point in time due to her psychiatric symptoms, she reports that her vitamin D has been chronically low and that replacement has not been adequately bringing the levels enough to the normal level she recently stopped Adderall and has no longer drinks Dr. Reino Kent daily, she lost some weight and feels good about this  No other complaints      Past Medical History:  Reviewed in [facility]    Past surgical History:  Reviewed in [facility]    Family History:  Family history reviewed in the medical records, cousin with SLE       Social History:  Reviewed in [facility]      Allergies:   Reviewed in the records      Current medication list:  Reviewed       Physical Examination:    There were no vitals taken for this visit.   GENERAL APPEARANCE: The patient is a well-nourished adult in no acute distress, very pleasant    Musculoskeletal exam revealed Heberden's nodes bilaterally consistent with osteoarthritis, minimal synovitis noted on examination of the hands and wrists otherwise      Five minute Schirmer's test without anesthesia:    OS: 13 mm  OD: 20 mm     Assessment:    1.  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  Initially diagnosed in 2014     The symptoms include severe sicca symptoms, dry eyes requiring punctal plugs and cauterization, dry mouth, severe photosensitivity, late onset Raynaud's Phenomenon, hand synovitis of the MCPs, positive SSB and ANA per the report, and biopsy-proven small fiber neuropathy    Serological testing at [facility] revealed low titer positive SSB, a minor salivary gland biopsy was negative    She was previously treated with hydroxychloroquine and we added leflunomide in 2021 leading to improvement of her symptoms, currently on leflunomide 20 mg daily      She previously reported hallucination with naltrexone and she is not comfortable resuming reduced doses, this might be helpful managing some of her fatigue moving forward which we discussed today    That said, we will go ahead and screen her for reversible causes of fatigue including B12 deficiency, persistently low vitamin D and hypothyroidism, will obtain the relevant labs, she reports that her vitamin D has been chronically low despite replacement, we will go ahead and give her aggressive replacement schedule and I recommended low impact exercises today to  managing her fatigue    She will remain on hydroxychloroquine leflunomide we will repeat labs today then every 3 months, otherwise I plan to see her back in approximately 6 months    2.  Fibromyalgia syndrome, established diagnosis, tried several pharmacological therapies    3.  History of severe generalized osteoarthritis, she is status post laminectomy of the lumbar spine, she also had replacement of the first [facility] bilaterally    4.  Right hip pain, the notes suggest history of impingement, previously evaluated by surgery service    5.  Low vitamin D, she started repletion last visit, will recheck levels today    6.  Long term use of Hydroxychloroquine. Last exam in November 2023    Plan:    We will not resume naltrexone for now  Start ergocalciferol 50,000 unit once daily for 1 week then once weekly long-term thereafter  Continue hydroxychloroquine and leflunomide without interruption  CBC and CMP today then 3 months  TSH B12 and vitamin D today  Annual eye exam    Return to the clinic in 6 months     Leverne Humbles, MD    Because this dictation was prepared with voice recognition software, there remains a potential for typographical errors or incorrect word choices by the system. We apologize for any inadvertent inconvenience from such an error.

## 2022-07-19 DIAGNOSIS — Z7969 Long term (current) use of other immunomodulators and immunosuppressants: Secondary | ICD-10-CM

## 2022-07-19 DIAGNOSIS — R5382 Chronic fatigue, unspecified: Secondary | ICD-10-CM

## 2022-07-19 DIAGNOSIS — M791 Myalgia, unspecified site: Secondary | ICD-10-CM

## 2022-07-24 ENCOUNTER — Encounter: Admit: 2022-07-24 | Discharge: 2022-07-24 | Payer: MEDICARE

## 2022-07-24 MED ORDER — LEFLUNOMIDE 20 MG PO TAB
20 mg | ORAL_TABLET | Freq: Every day | ORAL | 0 refills | 90.00000 days | Status: AC
Start: 2022-07-24 — End: ?

## 2022-07-24 NOTE — Telephone Encounter
Refill of leflunomide requested. Patient last seen 07/18/22 and scheduled for follow-up 02/12/23.    Per LOV notes,    Plan:     We will not resume naltrexone for now  Start ergocalciferol 50,000 unit once daily for 1 week then once weekly long-term thereafter  Continue hydroxychloroquine and leflunomide without interruption  CBC and CMP today then 3 months  TSH B12 and vitamin D today  Annual eye exam     Return to the clinic in 6 months     Leverne Humbles, MD.    Last lab 07/18/22    Refilled per rheumatology refill protocol.

## 2022-08-09 ENCOUNTER — Encounter: Admit: 2022-08-09 | Discharge: 2022-08-09 | Payer: MEDICARE

## 2022-08-09 MED ORDER — CYANOCOBALAMIN (VITAMIN B-12) 1,000 MCG/ML IJ SOLN
ORAL | 0 refills | 29.00000 days | Status: AC
Start: 2022-08-09 — End: ?

## 2022-08-09 MED ORDER — BD SAFETYGLIDE SYRINGE 1 ML 27 GAUGE X 5/8" MISC SYRG
3 refills | 28.00000 days | Status: AC
Start: 2022-08-09 — End: ?

## 2022-08-09 NOTE — Telephone Encounter
-----   Message from Tana Felts, MD sent at 08/09/2022  3:53 PM CDT -----  Yes please, subcutaneously, I would like to start therapy as soon as possible given that it is extremely low and could lead to neurological manifestations, suspect this will help her fatigue significantly  ----- Message -----  From: Arlina Robes, RN  Sent: 08/09/2022   3:49 PM CDT  To: Leverne Humbles, MD    Do you want to replace as usual?  ----- Message -----  From: Costella Hatcher, DO  Sent: 07/21/2022   1:41 PM CDT  To: Arlina Robes, RN    Ok, let's wait until he returns   ----- Message -----  From: Arlina Robes, RN  Sent: 07/21/2022  12:00 PM CDT  To: Costella Hatcher, DO    Usually with low B12 he'll prescribe SQ 1000 mcg once daily for 7 days, then once weekly for 4 weeks, then once monthly for 12 months, but I imagine he'd be fine with waiting until he's back if you'd prefer    ----- Message -----  From: Costella Hatcher, DO  Sent: 07/20/2022   9:17 AM CDT  To: Arlina Robes, RN    Thea Silversmith -her B12 is low.  I think it can wait to be addressed until Dr. Cecelia Byars returns from office.  Unless you know how he likes to typically replace low B12 (oral versus intramuscular) ?  ----- Message -----  From: Interface, In Results Misys  Sent: 07/18/2022   6:59 PM CDT  To: Leverne Humbles, MD

## 2022-08-09 NOTE — Telephone Encounter
Called and discussed with patient, advised of recommendation to start vitamin B12 replacement, patient states she has completed subcutaneous injections in the past and does not need teaching at this time. Discussed schedule of injecting 1000 mcg under the skin once daily for 7 days, then once weekly for 4 weeks, then once monthly for 12 months. Patient voiced understanding, scripts sent for cosign. Advised patient to reach out for any questions or concerns.

## 2022-08-23 ENCOUNTER — Encounter: Admit: 2022-08-23 | Discharge: 2022-08-23 | Payer: MEDICARE

## 2022-08-23 MED ORDER — ERGOCALCIFEROL (VITAMIN D2) 1,250 MCG (50,000 UNIT) PO CAP
ORAL_CAPSULE | 3 refills
Start: 2022-08-23 — End: ?

## 2022-10-05 ENCOUNTER — Encounter: Admit: 2022-10-05 | Discharge: 2022-10-05 | Payer: MEDICARE

## 2022-10-05 MED ORDER — LEFLUNOMIDE 20 MG PO TAB
20 mg | ORAL_TABLET | Freq: Every day | ORAL | 0 refills | 90.00000 days | Status: AC
Start: 2022-10-05 — End: ?

## 2022-10-05 NOTE — Telephone Encounter
LOV 07/18/22 Plan:     We will not resume naltrexone for now  Start ergocalciferol 50,000 unit once daily for 1 week then once weekly long-term thereafter  Continue hydroxychloroquine and leflunomide without interruption  CBC and CMP today then 3 months  TSH B12 and vitamin D today  Annual eye exam     Return to the clinic in 6 months     Leverne Humbles, MD    Next appt 02/12/23. Labs due at 10/16/22. Sent mychart message to pt to remind them of labs. Leflunomide refill approved per protocol.

## 2022-10-21 ENCOUNTER — Encounter: Admit: 2022-10-21 | Discharge: 2022-10-21 | Payer: MEDICARE

## 2022-10-21 MED ORDER — HYDROXYCHLOROQUINE 200 MG PO TAB
200 mg | ORAL_TABLET | Freq: Two times a day (BID) | ORAL | 1 refills
Start: 2022-10-21 — End: ?

## 2022-10-23 ENCOUNTER — Encounter: Admit: 2022-10-23 | Discharge: 2022-10-23 | Payer: MEDICARE

## 2022-12-14 ENCOUNTER — Encounter: Admit: 2022-12-14 | Discharge: 2022-12-14 | Payer: MEDICARE

## 2022-12-14 DIAGNOSIS — Z7969 Long term (current) use of other immunomodulators and immunosuppressants: Secondary | ICD-10-CM

## 2023-01-03 ENCOUNTER — Encounter: Admit: 2023-01-03 | Discharge: 2023-01-03 | Payer: MEDICARE

## 2023-01-03 MED ORDER — LEFLUNOMIDE 20 MG PO TAB
20 mg | ORAL_TABLET | Freq: Every day | ORAL | 0 refills | 90.00000 days | Status: AC
Start: 2023-01-03 — End: ?

## 2023-01-03 NOTE — Telephone Encounter
Pharmacy is requesting a refill of leflunomide. Pt last seen 07/18/22. Pt scheduled for 02/12/23. Last labs 12/07/22. Refilled per standing order protocol.    Recommendations per last OV:  We will not resume naltrexone for now  Start ergocalciferol 50,000 unit once daily for 1 week then once weekly long-term thereafter  Continue hydroxychloroquine and leflunomide without interruption  CBC and CMP today then 3 months  TSH B12 and vitamin D today  Annual eye exam     Return to the clinic in 6 months     Leverne Humbles, MD

## 2023-04-01 ENCOUNTER — Encounter: Admit: 2023-04-01 | Discharge: 2023-04-01 | Payer: MEDICARE

## 2023-04-06 ENCOUNTER — Encounter: Admit: 2023-04-06 | Discharge: 2023-04-06 | Payer: MEDICARE

## 2023-04-06 NOTE — Progress Notes
Per patient request, mailed lab recs to patient along with letter. Called patient and let her know. She will plan to get them done ASAP. She will call if she has not received recs by end of next week. Offered to send them via MyChart, but pt does not have a way to print them.

## 2023-04-18 ENCOUNTER — Encounter: Admit: 2023-04-18 | Discharge: 2023-04-18 | Payer: MEDICARE

## 2023-04-18 NOTE — Telephone Encounter
Call from patient, she had labs done in [place], [place] and mentioned she has changed her name due to recent divorce, now known as Kathleen Steele. Provided patient with virtual registration number to update info, patient states that her labs were completed under updated name in [place], [place] if labs need to be requested.

## 2023-04-25 ENCOUNTER — Encounter: Admit: 2023-04-25 | Discharge: 2023-04-25 | Payer: MEDICARE

## 2023-05-11 ENCOUNTER — Encounter: Admit: 2023-05-11 | Discharge: 2023-05-11 | Payer: MEDICARE

## 2023-05-11 MED ORDER — ERGOCALCIFEROL (VITAMIN D2) 1,250 MCG (50,000 UNIT) PO CAP
ORAL_CAPSULE | ORAL | 0 refills | 56.00000 days | Status: AC
Start: 2023-05-11 — End: ?

## 2023-06-12 ENCOUNTER — Encounter: Admit: 2023-06-12 | Discharge: 2023-06-12

## 2023-09-05 ENCOUNTER — Encounter: Admit: 2023-09-05 | Discharge: 2023-09-05 | Payer: MEDICARE
